# Patient Record
Sex: Female | Born: 2000 | Race: Black or African American | Hispanic: No | Marital: Single | Smoking: Former smoker
Health system: Southern US, Community
[De-identification: ages and names within clinical notes are randomized; demographics above are authoritative.]

## PROBLEM LIST (undated history)

## (undated) DIAGNOSIS — E559 Vitamin D deficiency, unspecified: Secondary | ICD-10-CM

## (undated) DIAGNOSIS — Z9109 Other allergy status, other than to drugs and biological substances: Secondary | ICD-10-CM

## (undated) DIAGNOSIS — J45909 Unspecified asthma, uncomplicated: Secondary | ICD-10-CM

---
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
## 2019-01-19 DIAGNOSIS — H40033 Anatomical narrow angle, bilateral: Secondary | ICD-10-CM | POA: Diagnosis not present

## 2019-01-19 DIAGNOSIS — H16223 Keratoconjunctivitis sicca, not specified as Sjogren's, bilateral: Secondary | ICD-10-CM | POA: Diagnosis not present

## 2019-03-05 DIAGNOSIS — H5213 Myopia, bilateral: Secondary | ICD-10-CM | POA: Diagnosis not present

## 2019-03-21 ENCOUNTER — Other Ambulatory Visit: Payer: Self-pay

## 2019-03-21 ENCOUNTER — Encounter (HOSPITAL_COMMUNITY): Payer: Self-pay

## 2019-03-21 ENCOUNTER — Emergency Department (HOSPITAL_COMMUNITY)
Admission: EM | Admit: 2019-03-21 | Discharge: 2019-03-21 | Disposition: A | Discharge status: Home / Self Care | Payer: Medicaid Other | Attending: Emergency Medicine | Admitting: Emergency Medicine

## 2019-03-21 ENCOUNTER — Emergency Department (HOSPITAL_COMMUNITY): Payer: Medicaid Other

## 2019-03-21 DIAGNOSIS — S0990XA Unspecified injury of head, initial encounter: Secondary | ICD-10-CM

## 2019-03-21 DIAGNOSIS — J45909 Unspecified asthma, uncomplicated: Secondary | ICD-10-CM | POA: Insufficient documentation

## 2019-03-21 DIAGNOSIS — Y9389 Activity, other specified: Secondary | ICD-10-CM | POA: Diagnosis not present

## 2019-03-21 DIAGNOSIS — Y999 Unspecified external cause status: Secondary | ICD-10-CM | POA: Diagnosis not present

## 2019-03-21 DIAGNOSIS — Y92810 Car as the place of occurrence of the external cause: Secondary | ICD-10-CM | POA: Diagnosis not present

## 2019-03-21 DIAGNOSIS — Y29XXXA Contact with blunt object, undetermined intent, initial encounter: Secondary | ICD-10-CM | POA: Diagnosis not present

## 2019-03-21 DIAGNOSIS — S060X0A Concussion without loss of consciousness, initial encounter: Secondary | ICD-10-CM | POA: Diagnosis not present

## 2019-03-21 DIAGNOSIS — F17210 Nicotine dependence, cigarettes, uncomplicated: Secondary | ICD-10-CM | POA: Diagnosis not present

## 2019-03-21 DIAGNOSIS — R5383 Other fatigue: Secondary | ICD-10-CM | POA: Diagnosis not present

## 2019-03-21 DIAGNOSIS — R51 Headache: Secondary | ICD-10-CM | POA: Diagnosis not present

## 2019-03-21 NOTE — ED Notes (Signed)
[place],MD made aware of patient needing CT.
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
## 2019-03-21 NOTE — ED Notes (Signed)
Pt ambulated in hall with steady gait.

## 2019-04-07 DIAGNOSIS — J302 Other seasonal allergic rhinitis: Secondary | ICD-10-CM | POA: Diagnosis not present

## 2019-04-07 DIAGNOSIS — L309 Dermatitis, unspecified: Secondary | ICD-10-CM | POA: Diagnosis not present

## 2019-04-07 DIAGNOSIS — E559 Vitamin D deficiency, unspecified: Secondary | ICD-10-CM | POA: Diagnosis not present

## 2019-04-07 DIAGNOSIS — J4520 Mild intermittent asthma, uncomplicated: Secondary | ICD-10-CM | POA: Diagnosis not present

## 2019-07-07 NOTE — L&D Delivery Note (Signed)
Delivery Note At 10:19 AM a viable female was delivered via Vaginal, Spontaneous (Presentation:   Occiput Anterior).  APGAR: 6 and 9, ; weight 2430g  Placenta status: Spontaneous, Intact.  Cord: 3 vessels with the following complications: None.  Cord pH: Unable to obtain  Anesthesia: Epidural Episiotomy: None Lacerations: Sulcus;Periurethral Suture Repair:  3-0 Vicryl and 2-0 Vicryl Est. Blood Loss (mL):  350cc  Mom to postpartum.  Baby to Couplet care / Skin to Skin.  Angie Mcdonald 04/10/2020, 10:56 AM

## 2019-08-08 DIAGNOSIS — H1013 Acute atopic conjunctivitis, bilateral: Secondary | ICD-10-CM | POA: Diagnosis not present

## 2019-08-28 DIAGNOSIS — J302 Other seasonal allergic rhinitis: Secondary | ICD-10-CM | POA: Diagnosis not present

## 2019-08-28 DIAGNOSIS — J4520 Mild intermittent asthma, uncomplicated: Secondary | ICD-10-CM | POA: Diagnosis not present

## 2019-08-28 DIAGNOSIS — N912 Amenorrhea, unspecified: Secondary | ICD-10-CM | POA: Diagnosis not present

## 2019-08-28 DIAGNOSIS — E559 Vitamin D deficiency, unspecified: Secondary | ICD-10-CM | POA: Diagnosis not present

## 2019-08-28 DIAGNOSIS — L309 Dermatitis, unspecified: Secondary | ICD-10-CM | POA: Diagnosis not present

## 2019-08-28 DIAGNOSIS — R110 Nausea: Secondary | ICD-10-CM | POA: Diagnosis not present

## 2019-08-28 DIAGNOSIS — Z3A01 Less than 8 weeks gestation of pregnancy: Secondary | ICD-10-CM | POA: Diagnosis not present

## 2019-09-07 DIAGNOSIS — H04123 Dry eye syndrome of bilateral lacrimal glands: Secondary | ICD-10-CM | POA: Diagnosis not present

## 2019-09-20 LAB — OB RESULTS CONSOLE RPR: RPR: NONREACTIVE

## 2019-09-20 LAB — OB RESULTS CONSOLE RUBELLA ANTIBODY, IGM: Rubella: IMMUNE

## 2019-09-20 LAB — OB RESULTS CONSOLE HIV ANTIBODY (ROUTINE TESTING): HIV: NONREACTIVE

## 2019-09-20 LAB — OB RESULTS CONSOLE HEPATITIS B SURFACE ANTIGEN: Hepatitis B Surface Ag: NEGATIVE

## 2019-09-20 LAB — OB RESULTS CONSOLE VARICELLA ZOSTER ANTIBODY, IGG: Varicella: IMMUNE

## 2019-09-22 DIAGNOSIS — Z3401 Encounter for supervision of normal first pregnancy, first trimester: Secondary | ICD-10-CM | POA: Diagnosis not present

## 2019-09-22 DIAGNOSIS — Z3201 Encounter for pregnancy test, result positive: Secondary | ICD-10-CM | POA: Diagnosis not present

## 2019-09-22 DIAGNOSIS — Z3400 Encounter for supervision of normal first pregnancy, unspecified trimester: Secondary | ICD-10-CM | POA: Diagnosis not present

## 2019-09-28 DIAGNOSIS — E669 Obesity, unspecified: Secondary | ICD-10-CM | POA: Diagnosis not present

## 2019-09-28 DIAGNOSIS — Z3401 Encounter for supervision of normal first pregnancy, first trimester: Secondary | ICD-10-CM | POA: Diagnosis not present

## 2019-09-28 DIAGNOSIS — Z3481 Encounter for supervision of other normal pregnancy, first trimester: Secondary | ICD-10-CM | POA: Diagnosis not present

## 2019-10-02 DIAGNOSIS — A64 Unspecified sexually transmitted disease: Secondary | ICD-10-CM | POA: Diagnosis not present

## 2019-10-02 DIAGNOSIS — H1013 Acute atopic conjunctivitis, bilateral: Secondary | ICD-10-CM | POA: Diagnosis not present

## 2019-11-03 DIAGNOSIS — O09292 Supervision of pregnancy with other poor reproductive or obstetric history, second trimester: Secondary | ICD-10-CM | POA: Diagnosis not present

## 2019-11-03 LAB — OB RESULTS CONSOLE GC/CHLAMYDIA
Chlamydia: NEGATIVE
Gonorrhea: NEGATIVE

## 2019-12-12 DIAGNOSIS — Z36 Encounter for antenatal screening for chromosomal anomalies: Secondary | ICD-10-CM | POA: Diagnosis not present

## 2019-12-13 DIAGNOSIS — Z3402 Encounter for supervision of normal first pregnancy, second trimester: Secondary | ICD-10-CM | POA: Diagnosis not present

## 2020-01-16 DIAGNOSIS — Z3402 Encounter for supervision of normal first pregnancy, second trimester: Secondary | ICD-10-CM | POA: Diagnosis not present

## 2020-02-13 DIAGNOSIS — Z23 Encounter for immunization: Secondary | ICD-10-CM | POA: Diagnosis not present

## 2020-02-13 DIAGNOSIS — Z3403 Encounter for supervision of normal first pregnancy, third trimester: Secondary | ICD-10-CM | POA: Diagnosis not present

## 2020-03-12 DIAGNOSIS — H5213 Myopia, bilateral: Secondary | ICD-10-CM | POA: Diagnosis not present

## 2020-03-25 IMAGING — CT CT HEAD W/O CM
3 series · 16 of 46 positions shown, 19 images · non-contrast
Comparison: None.

CLINICAL DATA: 18-year-old female with headache following head
injury yesterday. Initial encounter.

EXAM:
CT HEAD WITHOUT CONTRAST
TECHNIQUE: Contiguous axial images were obtained from the base of the skull
through the vertex without intravenous contrast.

[Series 2: head wo · axial · 0.47mm/px · z∈[-181,-61]mm · 10 of 29 slices shown, 13 images]
[im 3/29  brain]
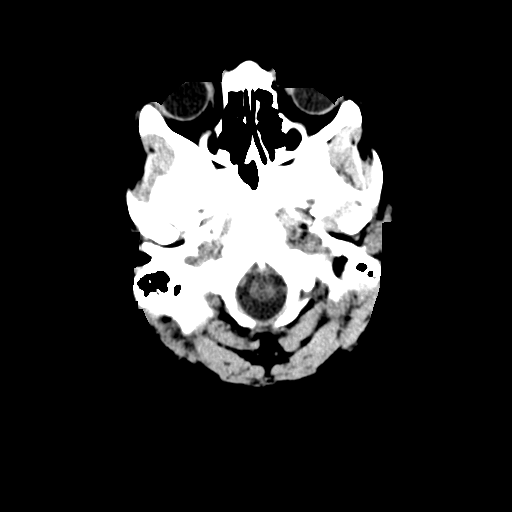
[im 3/29  bone]
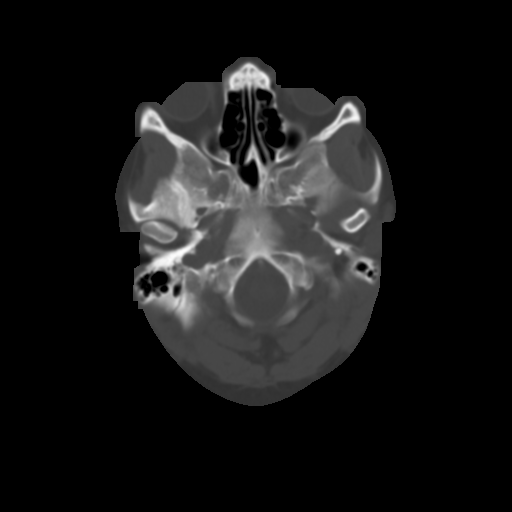
[im 6/29  brain]
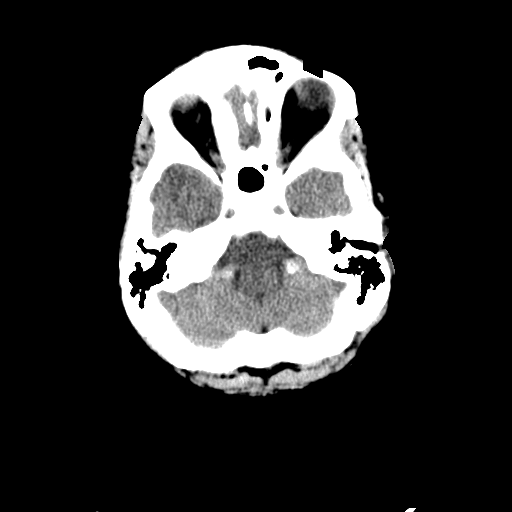
[im 8/29  brain]
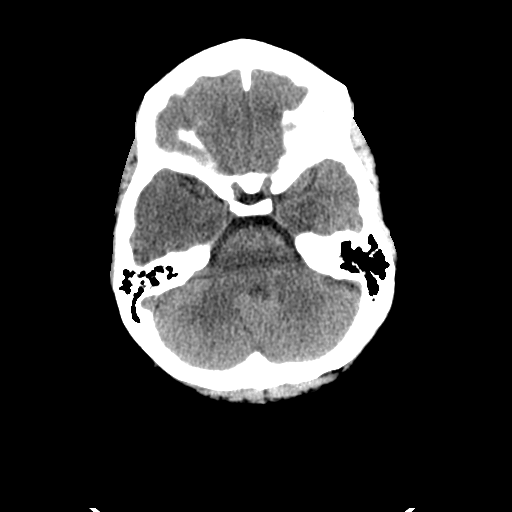
[im 11/29  brain]
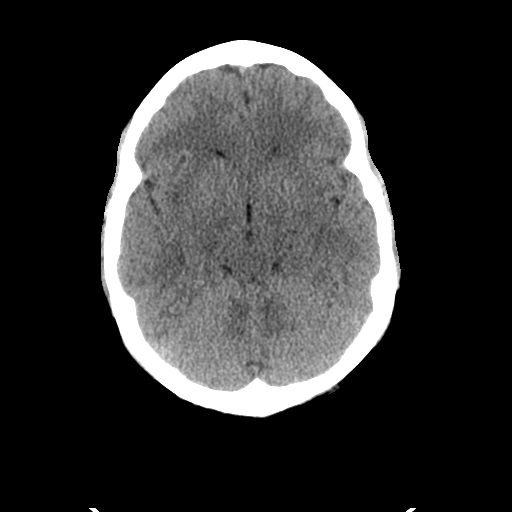
[im 14/29  brain]
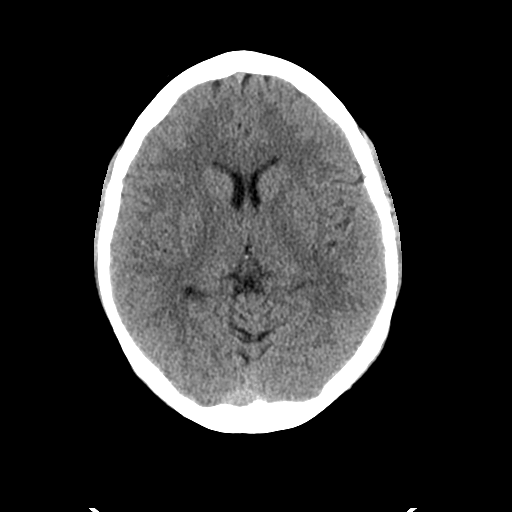
[im 14/29  bone]
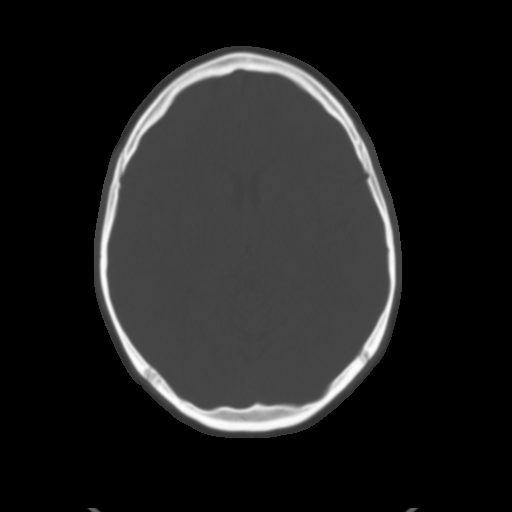
[im 16/29  brain]
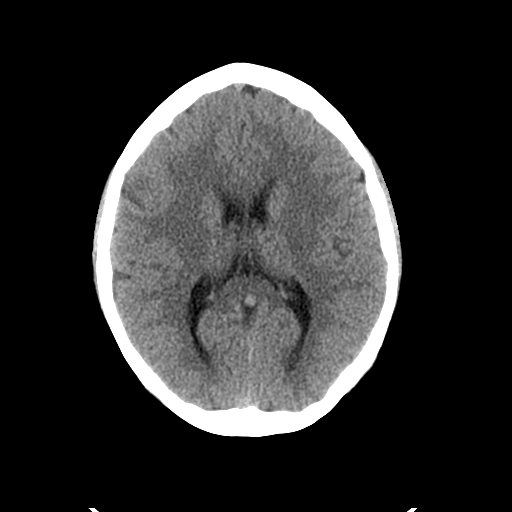
[im 19/29  brain]
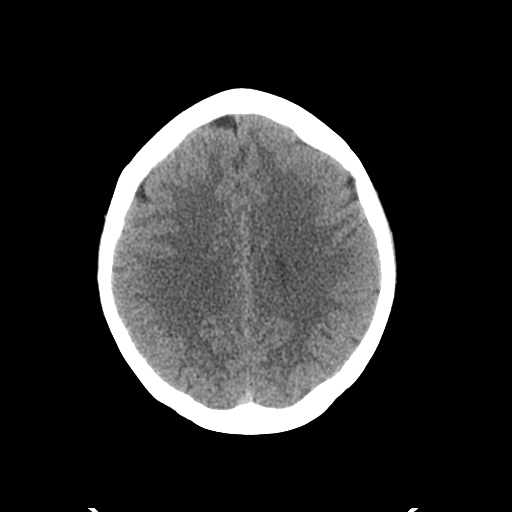
[im 22/29  brain]
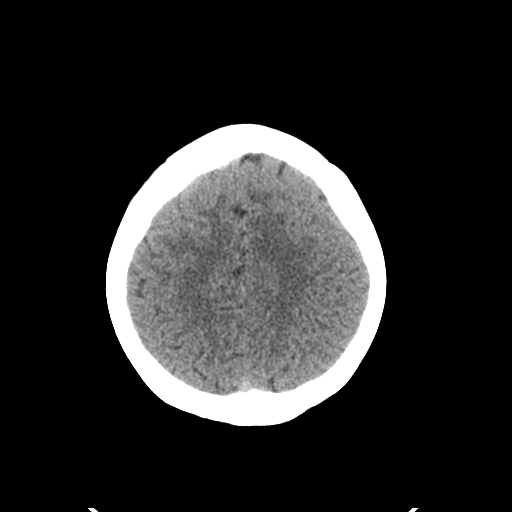
[im 24/29  brain]
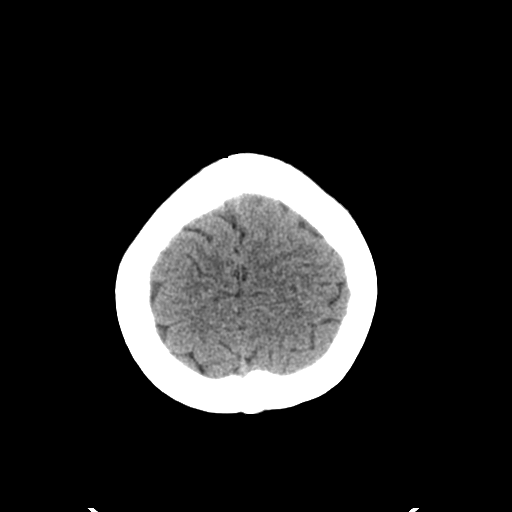
[im 24/29  bone]
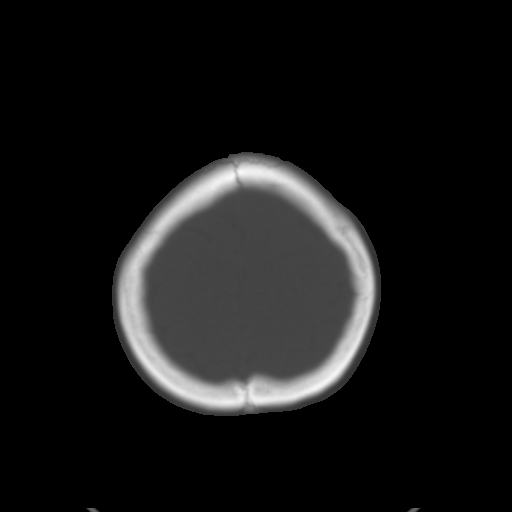
[im 27/29  brain]
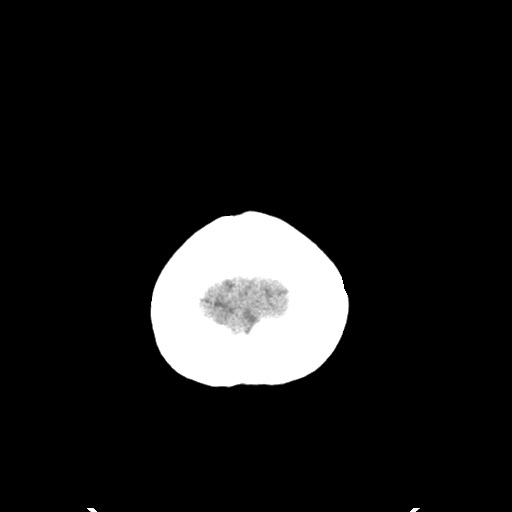

[Series 4: coronal soft tissue · coronal · 0.28mm/px · 3 of 63 slices shown]
[im 21/63  brain]
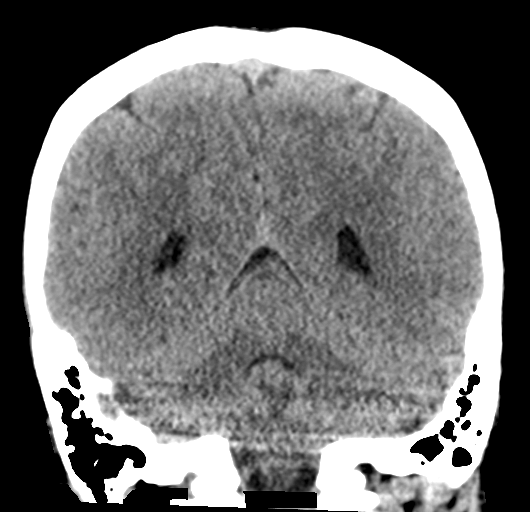
[im 28/63  brain]
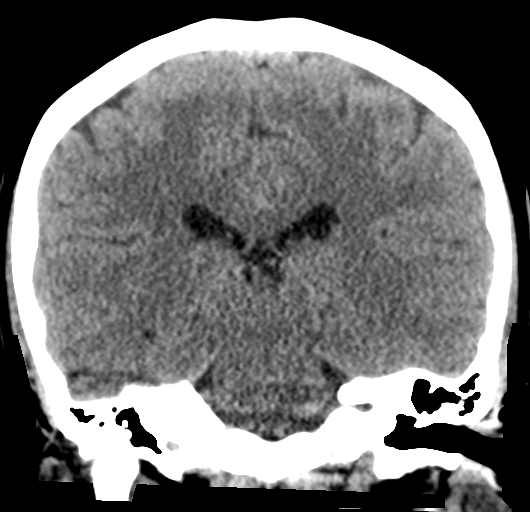
[im 35/63  brain]
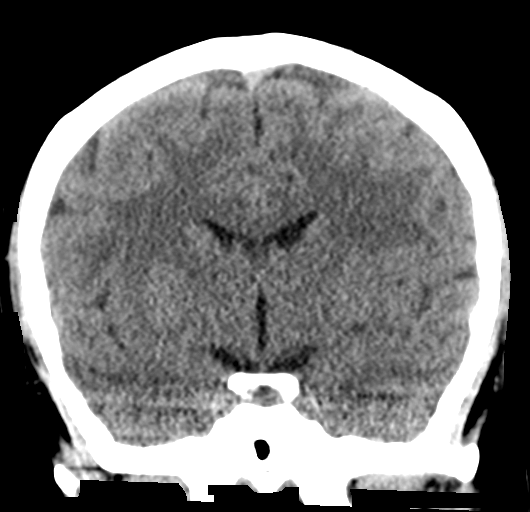

[Series 5: sagittal soft tissue · sagittal · 0.28mm/px · 3 of 51 slices shown]
[im 17/51  brain]
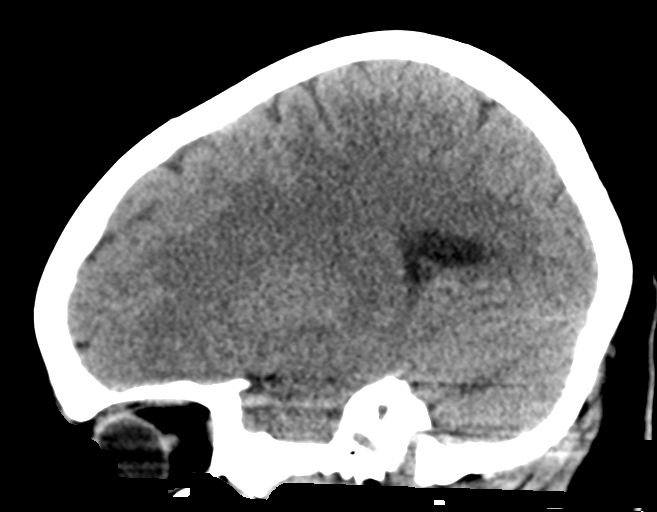
[im 26/51  brain]
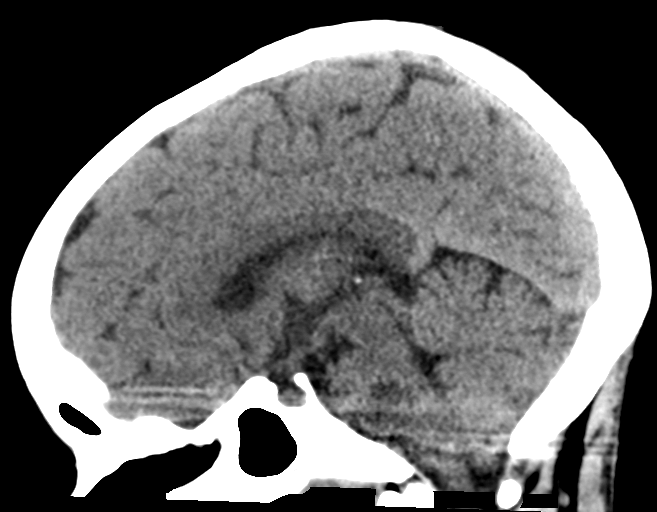
[im 34/51  brain]
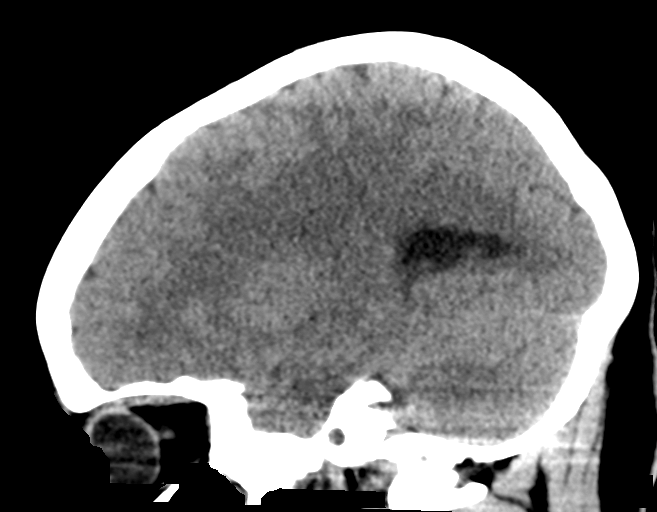

[16 of 46 positions shown; findings below may reference images not displayed]

FINDINGS: Brain: No evidence of acute infarction, hemorrhage, hydrocephalus,
extra-axial collection or mass lesion/mass effect.

Vascular: No hyperdense vessel or unexpected calcification.

Skull: Normal. Negative for fracture or focal lesion.

Sinuses/Orbits: No acute finding.

Other: None.
IMPRESSION: Unremarkable noncontrast head CT.

## 2020-03-27 DIAGNOSIS — O26843 Uterine size-date discrepancy, third trimester: Secondary | ICD-10-CM | POA: Diagnosis not present

## 2020-03-27 DIAGNOSIS — Z3403 Encounter for supervision of normal first pregnancy, third trimester: Secondary | ICD-10-CM | POA: Diagnosis not present

## 2020-03-27 LAB — OB RESULTS CONSOLE GBS: GBS: NEGATIVE

## 2020-04-03 DIAGNOSIS — O365931 Maternal care for other known or suspected poor fetal growth, third trimester, fetus 1: Secondary | ICD-10-CM | POA: Diagnosis not present

## 2020-04-05 ENCOUNTER — Encounter (HOSPITAL_COMMUNITY): Payer: Self-pay | Admitting: *Deleted

## 2020-04-05 ENCOUNTER — Telehealth (HOSPITAL_COMMUNITY): Payer: Self-pay | Admitting: *Deleted

## 2020-04-05 NOTE — Telephone Encounter (Signed)
Preadmission screen  

## 2020-04-05 NOTE — Telephone Encounter (Signed)
Preadmission screen Prenatal requested from office.  Pt states she thinks she is scheduled for induction on Sunday.  Instructed pt to call MD office and verify plan of induction.

## 2020-04-06 ENCOUNTER — Other Ambulatory Visit (HOSPITAL_COMMUNITY)
Admission: RE | Admit: 2020-04-06 | Discharge: 2020-04-06 | Disposition: A | Discharge status: Home / Self Care | Payer: Medicaid Other | Source: Ambulatory Visit | Attending: Obstetrics & Gynecology | Admitting: Obstetrics & Gynecology

## 2020-04-06 DIAGNOSIS — Z20822 Contact with and (suspected) exposure to covid-19: Secondary | ICD-10-CM | POA: Diagnosis not present

## 2020-04-06 DIAGNOSIS — Z01812 Encounter for preprocedural laboratory examination: Secondary | ICD-10-CM | POA: Diagnosis present

## 2020-04-06 LAB — SARS CORONAVIRUS 2 (TAT 6-24 HRS): SARS Coronavirus 2: NEGATIVE

## 2020-04-08 ENCOUNTER — Other Ambulatory Visit (HOSPITAL_COMMUNITY): Payer: No Typology Code available for payment source

## 2020-04-08 NOTE — H&P (Signed)
HPI: 19 y/o G1P0 @ [redacted]w[redacted]d estimated gestational age (as dated by LMP c/w 20 week ultrasound) presents for IOL due to IUGR.   no Leaking of Fluid,   no Vaginal Bleeding,   no Uterine Contractions,  + Fetal Movement.  Prenatal care has been provided by Dr. Charlotta Newton  ROS: no HA, no epigastric pain, no visual changes.    Pregnancy complicated by: 1) IUGR- [place] @ [redacted]w[redacted]d-vertex/post/EFW: 5#5oz (10%) 2) Obesity: starting BMI 45 3) h/o asthma- proair HFA when needed   Prenatal Transfer Tool  Maternal Diabetes: No Genetic Screening: Normal Maternal Ultrasounds/Referrals: IUGR Fetal Ultrasounds or other Referrals:  None Maternal Substance Abuse:  No Significant Maternal Medications:  Meds include: Other: ProAir Significant Maternal Lab Results: Group B Strep negative   PNL:  GBS neg, Rub Immune, Hep B neg, RPR NR, HIV neg, GC/C neg, glucola:105 Hgb: 11.4 Blood type: O pos, antibody neg  Immunizations: Tdap: 8/10  OBHx: primip PMHx:  Asthma Meds:  PNV, ProAir Allergy:  No Known Allergies SurgHx: none SocHx:   no Tobacco, no  EtOH, no Illicit Drugs  O: to be obtained Gen. AAOx3, NAD CV.  RRR  No murmur.  Resp. CTAB, no wheeze or crackles. Abd. Gravid,  no tenderness,  no rigidity,  no guarding Extr.  no edema B/L , no calf tenderness, neg Homan's B/L FHT: 140 by doppler  Labs: see orders  A/P:  19 y.o. G1P0 @ [redacted]w[redacted]d EGA who presents for IOL due to IUGR -FWB:  Reassuring by doppler -Labor: plan for IOL with cytotec -GBS: negative -Pain management: IV or epidural upon request  Myna Hidalgo, DO [phone] (cell) [phone] (office)

## 2020-04-09 ENCOUNTER — Inpatient Hospital Stay (HOSPITAL_COMMUNITY): Payer: Medicaid Other | Admitting: Anesthesiology

## 2020-04-09 ENCOUNTER — Inpatient Hospital Stay (HOSPITAL_COMMUNITY)
Admission: AD | Admit: 2020-04-09 | Discharge: 2020-04-12 | DRG: 807 | Disposition: A | Discharge status: Home / Self Care | Payer: Medicaid Other | Attending: Obstetrics and Gynecology | Admitting: Obstetrics and Gynecology

## 2020-04-09 ENCOUNTER — Encounter (HOSPITAL_COMMUNITY): Payer: Self-pay | Admitting: Obstetrics & Gynecology

## 2020-04-09 ENCOUNTER — Inpatient Hospital Stay (HOSPITAL_COMMUNITY): Payer: Medicaid Other

## 2020-04-09 ENCOUNTER — Other Ambulatory Visit: Payer: Self-pay

## 2020-04-09 DIAGNOSIS — O365930 Maternal care for other known or suspected poor fetal growth, third trimester, not applicable or unspecified: Secondary | ICD-10-CM | POA: Diagnosis not present

## 2020-04-09 DIAGNOSIS — J45909 Unspecified asthma, uncomplicated: Secondary | ICD-10-CM | POA: Diagnosis not present

## 2020-04-09 DIAGNOSIS — Z3A38 38 weeks gestation of pregnancy: Secondary | ICD-10-CM | POA: Diagnosis not present

## 2020-04-09 DIAGNOSIS — O9952 Diseases of the respiratory system complicating childbirth: Secondary | ICD-10-CM | POA: Diagnosis not present

## 2020-04-09 DIAGNOSIS — O99214 Obesity complicating childbirth: Secondary | ICD-10-CM | POA: Diagnosis not present

## 2020-04-09 DIAGNOSIS — O365990 Maternal care for other known or suspected poor fetal growth, unspecified trimester, not applicable or unspecified: Secondary | ICD-10-CM | POA: Diagnosis present

## 2020-04-09 LAB — CBC
HCT: 32.4 % — ABNORMAL LOW (ref 36.0–46.0)
Hemoglobin: 10.6 g/dL — ABNORMAL LOW (ref 12.0–15.0)
MCH: 25.4 pg — ABNORMAL LOW (ref 26.0–34.0)
MCHC: 32.7 g/dL (ref 30.0–36.0)
MCV: 77.7 fL — ABNORMAL LOW (ref 80.0–100.0)
Platelets: 274 10*3/uL (ref 150–400)
RBC: 4.17 MIL/uL (ref 3.87–5.11)
RDW: 12.9 % (ref 11.5–15.5)
WBC: 11.4 10*3/uL — ABNORMAL HIGH (ref 4.0–10.5)
nRBC: 0 % (ref 0.0–0.2)

## 2020-04-09 LAB — RPR: RPR Ser Ql: NONREACTIVE

## 2020-04-09 LAB — TYPE AND SCREEN
ABO/RH(D): O POS
Antibody Screen: NEGATIVE

## 2020-04-09 MED ORDER — OXYTOCIN BOLUS FROM INFUSION
333.0000 mL | Freq: Once | INTRAVENOUS | Status: AC
  Administered 2020-04-10: 333 mL via INTRAVENOUS

## 2020-04-09 MED ORDER — OXYTOCIN-SODIUM CHLORIDE 30-0.9 UT/500ML-% IV SOLN
1.0000 m[IU]/min | INTRAVENOUS | Status: DC
  Administered 2020-04-10: 4 m[IU]/min via INTRAVENOUS

## 2020-04-09 MED ORDER — LIDOCAINE-EPINEPHRINE (PF) 2 %-1:200000 IJ SOLN
INTRAMUSCULAR | Status: DC | PRN
  Administered 2020-04-09 (×2): 3 mL via EPIDURAL

## 2020-04-09 MED ORDER — SOD CITRATE-CITRIC ACID 500-334 MG/5ML PO SOLN
30.0000 mL | ORAL | Status: DC | PRN
  Administered 2020-04-09: 30 mL via ORAL
  Filled 2020-04-09: qty 15

## 2020-04-09 MED ORDER — FENTANYL CITRATE (PF) 2500 MCG/50ML IJ SOLN
INTRAMUSCULAR | Status: DC | PRN
Start: 2020-04-09 — End: 2020-04-10
  Administered 2020-04-09: 12 mL/h via EPIDURAL

## 2020-04-09 MED ORDER — FENTANYL-BUPIVACAINE-NACL 0.5-0.125-0.9 MG/250ML-% EP SOLN
12.0000 mL/h | EPIDURAL | Status: DC | PRN
  Filled 2020-04-09: qty 250

## 2020-04-09 MED ORDER — EPHEDRINE 5 MG/ML INJ: 10.0000 mg | INTRAVENOUS | Status: DC | PRN

## 2020-04-09 MED ORDER — LACTATED RINGERS IV SOLN: 500.0000 mL | INTRAVENOUS | Status: DC | PRN

## 2020-04-09 MED ORDER — ONDANSETRON HCL 4 MG/2ML IJ SOLN
4.0000 mg | Freq: Four times a day (QID) | INTRAMUSCULAR | Status: DC | PRN
  Administered 2020-04-10: 4 mg via INTRAVENOUS
  Filled 2020-04-09: qty 2

## 2020-04-09 MED ORDER — PHENYLEPHRINE 40 MCG/ML (10ML) SYRINGE FOR IV PUSH (FOR BLOOD PRESSURE SUPPORT): 80.0000 ug | PREFILLED_SYRINGE | INTRAVENOUS | Status: DC | PRN

## 2020-04-09 MED ORDER — LACTATED RINGERS IV SOLN: INTRAVENOUS | Status: DC

## 2020-04-09 MED ORDER — OXYCODONE-ACETAMINOPHEN 5-325 MG PO TABS: 1.0000 | ORAL_TABLET | ORAL | Status: DC | PRN

## 2020-04-09 MED ORDER — DIPHENHYDRAMINE HCL 50 MG/ML IJ SOLN: 12.5000 mg | INTRAMUSCULAR | Status: DC | PRN

## 2020-04-09 MED ORDER — OXYTOCIN-SODIUM CHLORIDE 30-0.9 UT/500ML-% IV SOLN
1.0000 m[IU]/min | INTRAVENOUS | Status: DC
  Administered 2020-04-09: 1 m[IU]/min via INTRAVENOUS
  Filled 2020-04-09: qty 500

## 2020-04-09 MED ORDER — ACETAMINOPHEN 325 MG PO TABS: 650.0000 mg | ORAL_TABLET | ORAL | Status: DC | PRN

## 2020-04-09 MED ORDER — LIDOCAINE HCL (PF) 1 % IJ SOLN: 30.0000 mL | INTRAMUSCULAR | Status: DC | PRN

## 2020-04-09 MED ORDER — TERBUTALINE SULFATE 1 MG/ML IJ SOLN: 0.2500 mg | Freq: Once | INTRAMUSCULAR | Status: DC | PRN

## 2020-04-09 MED ORDER — OXYCODONE-ACETAMINOPHEN 5-325 MG PO TABS: 2.0000 | ORAL_TABLET | ORAL | Status: DC | PRN

## 2020-04-09 MED ORDER — MISOPROSTOL 25 MCG QUARTER TABLET
25.0000 ug | ORAL_TABLET | ORAL | Status: DC | PRN
  Administered 2020-04-09 (×3): 25 ug via VAGINAL
  Filled 2020-04-09 (×3): qty 1

## 2020-04-09 MED ORDER — FENTANYL CITRATE (PF) 100 MCG/2ML IJ SOLN
50.0000 ug | INTRAMUSCULAR | Status: DC | PRN
  Administered 2020-04-09 (×3): 100 ug via INTRAVENOUS
  Filled 2020-04-09 (×3): qty 2

## 2020-04-09 MED ORDER — LACTATED RINGERS IV SOLN
500.0000 mL | Freq: Once | INTRAVENOUS | Status: AC
  Administered 2020-04-09: 500 mL via INTRAVENOUS

## 2020-04-09 MED ORDER — OXYTOCIN-SODIUM CHLORIDE 30-0.9 UT/500ML-% IV SOLN: 2.5000 [IU]/h | INTRAVENOUS | Status: DC

## 2020-04-09 NOTE — Progress Notes (Addendum)
OB Progress Note  S: Reports some mild cramping.  Has had 2 cytotecs so far.  O: BP (!) 89/54   Pulse 78   Temp 98.7 F (37.1 C) (Oral)   Resp 18   Ht 4\' 9"  (1.448 m)   Wt 99.8 kg   LMP 07/17/2019   BMI 47.63 kg/m   FHT: 140s bpm, moderate variablity, + accels, - decels Toco: Irritability SVE: 1/thick/high  A/P: 19 y.o. G1P0 @ [redacted]w[redacted]d admitted for IOL for IUGR 10%. 1. FWB: Cat. I 2. Labor: Continue cervical ripening with Cytotec, unsuccessful foley bulb placement due to intolerance of exam. Pain: Per patient request GBS: Negative  [redacted]w[redacted]d, DO [phone] (office)

## 2020-04-09 NOTE — Anesthesia Procedure Notes (Signed)

## 2020-04-09 NOTE — Progress Notes (Signed)
OB Progress Note  S: Reports some mild cramping.  Status post cytotec x 3.    O: BP 109/65   Pulse 74   Temp 97.9 F (36.6 C) (Oral)   Resp 18   Ht 4\' 9"  (1.448 m)   Wt 99.8 kg   LMP 07/17/2019   BMI 47.63 kg/m   FHT: 140s bpm, moderate variablity, + accels, - decels Toco: every 1-1.5 minutes SVE: 1-2/40/high, cervix posterior, firm but softer than previous exam  A/P: 19 y.o. G1P0 @ [redacted]w[redacted]d admitted for IOL for IUGR 10%. 1. FWB: Cat. I 2. Labor: Status post cytotec x 3.  FB placed without difficulty and placed to traction. Pain: Fentanyl [redacted]w[redacted]d x 1 prior to FB insertion GBS: Negative  , DO [phone] (office)

## 2020-04-09 NOTE — Progress Notes (Signed)
   Subjective: Patient is comfortable with her epidural   Objective: BP (!) 95/58   Pulse 75   Temp 99.8 F (37.7 C) (Oral)   Resp 17   Ht 4\' 9"  (1.448 m)   Wt 99.8 kg   LMP 07/17/2019   SpO2 100%   BMI 47.63 kg/m  No intake/output data recorded. No intake/output data recorded.  FHT:  FHR: 140 bpm, variability: moderate,  accelerations:  Present,  decelerations:  Absent UC:   regular, every 1 minutes SVE:   Dilation: 6 Effacement (%): 80 Station: -1 (molding) Exam by:: Dr. [email or web address]  Labs: Lab Results  Component Value Date   WBC 11.4 (H) 04/09/2020   HGB 10.6 (L) 04/09/2020   HCT 32.4 (L) 04/09/2020   MCV 77.7 (L) 04/09/2020   PLT 274 04/09/2020    Assessment / Plan: Induction of labor due to IUGR,  progressing well on pitocin  Labor: Progressing normally Preeclampsia:  NA Fetal Wellbeing:  Category I Pain Control:  Epidural I/D:  n/a Anticipated MOD:  NSVD  06/09/2020 04/09/2020, 10:57 PM

## 2020-04-09 NOTE — Progress Notes (Signed)
   Subjective: Pt reports contractions every 2 minutes   Objective: BP (!) 97/54   Pulse 65   Temp 99.8 F (37.7 C) (Oral)   Resp 17   Ht 4\' 9"  (1.448 m)   Wt 99.8 kg   LMP 07/17/2019   BMI 47.63 kg/m  No intake/output data recorded. No intake/output data recorded.  FHT:  FHR: 140 bpm, variability: moderate,  accelerations:  Present,  decelerations:  Absent UC:   regular, every 2 minutes SVE:   Dilation: 4 Effacement (%): 50 Station: -3 Exam by:: Dr. [email or web address]  Labs: Lab Results  Component Value Date   WBC 11.4 (H) 04/09/2020   HGB 10.6 (L) 04/09/2020   HCT 32.4 (L) 04/09/2020   MCV 77.7 (L) 04/09/2020   PLT 274 04/09/2020    Assessment / Plan: Induction of labor due to IUGR,  progressing well on pitocin  Labor: Progressing on Pitocin, will continue to increase then AROM Preeclampsia:  NA Fetal Wellbeing:  Category I Pain Control:  IV pain meds I/D:  n/a Anticipated MOD:  NSVD  06/09/2020 04/09/2020, 6:11 PM

## 2020-04-09 NOTE — Anesthesia Preprocedure Evaluation (Signed)
Anesthesia Evaluation  Patient identified by MRN, date of birth, ID band Patient awake    Reviewed: Allergy & Precautions, NPO status , Patient's Chart, lab work & pertinent test results  Airway Mallampati: II  TM Distance: >3 FB Neck ROM: Full    Dental no notable dental hx.    Pulmonary asthma , former smoker,    Pulmonary exam normal breath sounds clear to auscultation       Cardiovascular negative cardio ROS Normal cardiovascular exam Rhythm:Regular Rate:Normal     Neuro/Psych negative neurological ROS  negative psych ROS   GI/Hepatic negative GI ROS, Neg liver ROS,   Endo/Other  Morbid obesity (BMI 48)  Renal/GU negative Renal ROS  negative genitourinary   Musculoskeletal negative musculoskeletal ROS (+)   Abdominal   Peds  Hematology negative hematology ROS (+)   Anesthesia Other Findings IOL for IUGR  Reproductive/Obstetrics (+) Pregnancy                             Anesthesia Physical Anesthesia Plan  ASA: III  Anesthesia Plan: Epidural   Post-op Pain Management:    Induction:   PONV Risk Score and Plan: Treatment may vary due to age or medical condition  Airway Management Planned: Natural Airway  Additional Equipment:   Intra-op Plan:   Post-operative Plan:   Informed Consent: I have reviewed the patients History and Physical, chart, labs and discussed the procedure including the risks, benefits and alternatives for the proposed anesthesia with the patient or authorized representative who has indicated his/her understanding and acceptance.       Plan Discussed with: Anesthesiologist  Anesthesia Plan Comments: (Patient identified. Risks, benefits, options discussed with patient including but not limited to bleeding, infection, nerve damage, paralysis, failed block, incomplete pain control, headache, blood pressure changes, nausea, vomiting, reactions to  medication, itching, and post partum back pain. Confirmed with bedside nurse the patient's most recent platelet count. Confirmed with the patient that they are not taking any anticoagulation, have any bleeding history or any family history of bleeding disorders. Patient expressed understanding and wishes to proceed. All questions were answered. )        Anesthesia Quick Evaluation

## 2020-04-09 NOTE — Progress Notes (Signed)
  Subjective: Patient is comfortable with the epidural.   Objective: BP 112/70   Pulse 74   Temp 99.8 F (37.7 C) (Oral)   Resp 17   Ht 4\' 9"  (1.448 m)   Wt 99.8 kg   LMP 07/17/2019   SpO2 100%   BMI 47.63 kg/m  No intake/output data recorded. No intake/output data recorded.  FHT:  FHR: 145 bpm, variability: moderate,  accelerations:  Present,  decelerations:  Absent UC:   regular, every 2-5 minutes on 8 mu of pitocin.03/11/2021Marland KitchenIUPC placed  SVE:   Dilation: 5.5 Effacement (%): 60, 70 Station: -1 Exam by:: MScott, RN  Labs: Lab Results  Component Value Date   WBC 11.4 (H) 04/09/2020   HGB 10.6 (L) 04/09/2020   HCT 32.4 (L) 04/09/2020   MCV 77.7 (L) 04/09/2020   PLT 274 04/09/2020    Assessment / Plan: induction of labor due to IUGR... IUPC placed.. will continue pitocin to reach mvU's of 200  Labor: continue pitocin Preeclampsia:  NA Fetal Wellbeing:  Category I Pain Control:  Epidural I/D:  n/a Anticipated MOD:  NSVD  06/09/2020 04/09/2020, 8:45 PM

## 2020-04-10 ENCOUNTER — Encounter (HOSPITAL_COMMUNITY): Payer: Self-pay | Admitting: Obstetrics & Gynecology

## 2020-04-10 DIAGNOSIS — Z3A38 38 weeks gestation of pregnancy: Secondary | ICD-10-CM | POA: Diagnosis not present

## 2020-04-10 MED ORDER — MISOPROSTOL 200 MCG PO TABS
ORAL_TABLET | ORAL | Status: AC
  Administered 2020-04-10: 1000 ug
  Filled 2020-04-10: qty 5

## 2020-04-10 MED ORDER — SIMETHICONE 80 MG PO CHEW: 80.0000 mg | CHEWABLE_TABLET | ORAL | Status: DC | PRN

## 2020-04-10 MED ORDER — COCONUT OIL OIL: 1.0000 "application " | TOPICAL_OIL | Status: DC | PRN

## 2020-04-10 MED ORDER — BENZOCAINE-MENTHOL 20-0.5 % EX AERO
1.0000 "application " | INHALATION_SPRAY | CUTANEOUS | Status: DC | PRN
  Administered 2020-04-10: 1 via TOPICAL
  Filled 2020-04-10: qty 56

## 2020-04-10 MED ORDER — ZOLPIDEM TARTRATE 5 MG PO TABS: 5.0000 mg | ORAL_TABLET | Freq: Every evening | ORAL | Status: DC | PRN

## 2020-04-10 MED ORDER — WITCH HAZEL-GLYCERIN EX PADS: 1.0000 "application " | MEDICATED_PAD | CUTANEOUS | Status: DC | PRN

## 2020-04-10 MED ORDER — DIPHENHYDRAMINE HCL 25 MG PO CAPS: 25.0000 mg | ORAL_CAPSULE | Freq: Four times a day (QID) | ORAL | Status: DC | PRN

## 2020-04-10 MED ORDER — IBUPROFEN 600 MG PO TABS
600.0000 mg | ORAL_TABLET | Freq: Four times a day (QID) | ORAL | Status: DC
  Administered 2020-04-10 – 2020-04-12 (×9): 600 mg via ORAL
  Filled 2020-04-10 (×9): qty 1

## 2020-04-10 MED ORDER — SENNOSIDES-DOCUSATE SODIUM 8.6-50 MG PO TABS
2.0000 | ORAL_TABLET | ORAL | Status: DC
  Administered 2020-04-10: 2 via ORAL
  Filled 2020-04-10 (×2): qty 2

## 2020-04-10 MED ORDER — ACETAMINOPHEN 325 MG PO TABS
650.0000 mg | ORAL_TABLET | ORAL | Status: DC | PRN
  Administered 2020-04-10: 650 mg via ORAL
  Filled 2020-04-10: qty 2

## 2020-04-10 MED ORDER — ONDANSETRON HCL 4 MG PO TABS: 4.0000 mg | ORAL_TABLET | ORAL | Status: DC | PRN

## 2020-04-10 MED ORDER — DIBUCAINE (PERIANAL) 1 % EX OINT: 1.0000 "application " | TOPICAL_OINTMENT | CUTANEOUS | Status: DC | PRN

## 2020-04-10 MED ORDER — ALBUTEROL SULFATE HFA 108 (90 BASE) MCG/ACT IN AERS
2.0000 | INHALATION_SPRAY | RESPIRATORY_TRACT | Status: DC | PRN
  Filled 2020-04-10: qty 6.7

## 2020-04-10 MED ORDER — PRENATAL MULTIVITAMIN CH
1.0000 | ORAL_TABLET | Freq: Every day | ORAL | Status: DC
  Administered 2020-04-11 – 2020-04-12 (×2): 1 via ORAL
  Filled 2020-04-10 (×2): qty 1

## 2020-04-10 MED ORDER — ONDANSETRON HCL 4 MG/2ML IJ SOLN: 4.0000 mg | INTRAMUSCULAR | Status: DC | PRN

## 2020-04-10 NOTE — Progress Notes (Signed)
   Subjective: Patient is comfortable with her epidural   Objective: BP 118/76   Pulse 87   Temp 98.2 F (36.8 C) (Oral)   Resp 17   Ht 4\' 9"  (1.448 m)   Wt 99.8 kg   LMP 07/17/2019   SpO2 100%   BMI 47.63 kg/m  No intake/output data recorded. Total I/O In: -  Out: 575 [Urine:575]  FHT:  FHR: 140 bpm, variability: moderate,  accelerations:  Present,  decelerations:  Absent UC:   irregular, every 2-5 minutes SVE:   Dilation: 9 Effacement (%): 90 Station: Plus 1, Plus 2 Exam by:: Dr. [email or web address]  Labs: Lab Results  Component Value Date   WBC 11.4 (H) 04/09/2020   HGB 10.6 (L) 04/09/2020   HCT 32.4 (L) 04/09/2020   MCV 77.7 (L) 04/09/2020   PLT 274 04/09/2020    Assessment / Plan: induction of labor due to IUGR.. pitocin discontinued due to tachysystole... this resolve will now restart pitocin 2x2  Labor: protracted active phase  Preeclampsia:  NA Fetal Wellbeing:  Category I Pain Control:  Epidural I/D:  n/a Anticipated MOD:  NSVD  06/09/2020 04/10/2020, 6:39 AM

## 2020-04-10 NOTE — Progress Notes (Signed)
Transported to mother baby.

## 2020-04-10 NOTE — Progress Notes (Signed)
OB Progress Note  S: Comfortable with epidural.  Agreeable to practice pushing.  O: BP 128/83 (BP Location: Left Arm)    Pulse 88    Temp 98.9 F (37.2 C) (Oral)    Resp 18    Ht 4\' 9"  (1.448 m)    Wt 99.8 kg    LMP 07/17/2019    SpO2 100%    BMI 47.63 kg/m   FHT: 120 bpm, moderate variablity, + accels, - decels Toco: 1-3 minutes SVE: reducible anterior lip, 0 station  A/P: 19 y.o. G1P0 @ [redacted]w[redacted]d admitted for IOL for IUGR 10%. 1. FWB: Cat. I 2. Labor: Status post cytotec x 3 and FB.  Attempted practice pushing with good maternal effort but will titrate pitocin upward to allow for more frequent and stronger contractions before pushing again. Pain: Epidural in place GBS: Negative  [redacted]w[redacted]d, DO [phone] (office)

## 2020-04-10 NOTE — Anesthesia Postprocedure Evaluation (Signed)
Anesthesia Post Note  Patient: Angie Mcdonald  Procedure(s) Performed: AN AD HOC LABOR EPIDURAL     Patient location during evaluation: Mother Baby Anesthesia Type: Epidural Level of consciousness: awake and alert and oriented Pain management: satisfactory to patient Vital Signs Assessment: post-procedure vital signs reviewed and stable Respiratory status: spontaneous breathing and nonlabored ventilation Cardiovascular status: stable Postop Assessment: no headache, no backache, no signs of nausea or vomiting, adequate PO intake, patient able to bend at knees and able to ambulate (patient up walking) Anesthetic complications: no   No complications documented.  Last Vitals:  Vitals:   04/10/20 1220 04/10/20 1322  BP: 107/80 112/80  Pulse: 97 93  Resp: 18 18  Temp: 36.5 C 37.2 C  SpO2: 100% 99%    Last Pain:  Vitals:   04/10/20 1322  TempSrc:   PainSc: 0-No pain   Pain Goal: Patients Stated Pain Goal: 3 (04/10/20 1220)                 Madison Hickman

## 2020-04-11 LAB — CBC
HCT: 27.1 % — ABNORMAL LOW (ref 36.0–46.0)
Hemoglobin: 8.9 g/dL — ABNORMAL LOW (ref 12.0–15.0)
MCH: 25.7 pg — ABNORMAL LOW (ref 26.0–34.0)
MCHC: 32.8 g/dL (ref 30.0–36.0)
MCV: 78.3 fL — ABNORMAL LOW (ref 80.0–100.0)
Platelets: 224 10*3/uL (ref 150–400)
RBC: 3.46 MIL/uL — ABNORMAL LOW (ref 3.87–5.11)
RDW: 13.3 % (ref 11.5–15.5)
WBC: 19.9 10*3/uL — ABNORMAL HIGH (ref 4.0–10.5)
nRBC: 0 % (ref 0.0–0.2)

## 2020-04-11 NOTE — Progress Notes (Signed)
Pt out walking in hallway and c/o of a cramp in the back of her right leg just above the knee. Physical observation unremarkable, no heat to touch, no calf pain. Small heat pad applied and pain relieved.  Pt asked to call me if she has further pain.

## 2020-04-12 MED ORDER — IBUPROFEN 800 MG PO TABS: 800.0000 mg | ORAL_TABLET | Freq: Three times a day (TID) | ORAL | 0 refills | Status: DC | PRN

## 2020-04-12 NOTE — Discharge Summary (Signed)
Postpartum Discharge Summary  04/12/20    Patient Name: Angie Mcdonald DOB: 07/10/2000 MRN: [id number]  Date of admission: 04/09/2020 Delivery date:04/10/2020  Delivering provider: DAVIES, MELISSA  Date of discharge: 04/12/2020  Admitting diagnosis: IUGR (intrauterine growth restriction) affecting care of mother [O36.5990] Intrauterine pregnancy: [redacted]w[redacted]d     Secondary diagnosis:  Active Problems:   IUGR (intrauterine growth restriction) affecting care of mother  Additional problems: Maternal obesity (BMI 45), hx asthma   Discharge diagnosis: Term Pregnancy Delivered                                              Post partum procedures:None Augmentation: AROM, Pitocin, Cytotec and IP Foley Complications: None  Hospital course: Induction of Labor With Vaginal Delivery   19 y.o. yo G1P1001 at [redacted]w[redacted]d was admitted to the hospital 04/09/2020 for induction of labor.  Indication for induction: IUGR 10%.  Patient had an uncomplicated labor course as follows: Membrane Rupture Time/Date: 6:14 PM ,04/09/2020   Delivery Method:Vaginal, Spontaneous  Episiotomy: None  Lacerations:  Sulcus;Periurethral  Details of delivery can be found in separate delivery note.  Patient had a routine postpartum course. Patient is discharged home 04/12/20.  Newborn Data: Birth date:04/10/2020  Birth time:10:19 AM  Gender:Female  Living status:Living  Apgars:6 ,9  Weight:2430 g   Magnesium Sulfate received: No BMZ received: No Rhophylac:N/A MMR:N/A T-DaP:Given prenatally Flu: No Transfusion:No  Physical exam  Vitals:   04/11/20 0522 04/11/20 1400 04/11/20 2338 04/12/20 0525  BP: (!) 88/51 99/78 (!) 98/58 103/68  Pulse: 82 73 91 77  Resp: 16 16 18 16  Temp: 97.9 F (36.6 C) 98.8 F (37.1 C) 98.1 F (36.7 C) 97.7 F (36.5 C)  TempSrc: Oral Oral Oral Oral  SpO2: 100%  100% 100%  Weight:      Height:       General: alert, cooperative and no distress Lochia: appropriate Uterine Fundus: firm Incision:  N/A DVT Evaluation: No evidence of DVT seen on physical exam. No cords or calf tenderness. No significant calf/ankle edema. Labs: Lab Results  Component Value Date   WBC 19.9 (H) 04/11/2020   HGB 8.9 (L) 04/11/2020   HCT 27.1 (L) 04/11/2020   MCV 78.3 (L) 04/11/2020   PLT 224 04/11/2020   CMP Latest Ref Rng & Units 12/07/2017  Glucose 65 - 99 mg/dL 83  BUN 6 - 20 mg/dL 9  Creatinine 0.44 - 1.00 mg/dL 0.70  Sodium 135 - 145 mmol/L 137  Potassium 3.5 - 5.1 mmol/L 4.4  Chloride 101 - 111 mmol/L 106  CO2 22 - 32 mmol/L -  Calcium 8.9 - 10.3 mg/dL -  Total Protein 6.5 - 8.1 g/dL -  Total Bilirubin 0.3 - 1.2 mg/dL -  Alkaline Phos 38 - 126 U/L -  AST 15 - 41 U/L -  ALT 14 - 54 U/L -   Edinburgh Score: Edinburgh Postnatal Depression Scale Screening Tool 04/10/2020  I have been able to laugh and see the funny side of things. 0  I have looked forward with enjoyment to things. 0  I have blamed myself unnecessarily when things went wrong. 1  I have been anxious or worried for no good reason. 1  I have felt scared or panicky for no good reason. 0  Things have been getting on top of me. 0  I have been so unhappy that I have   had difficulty sleeping. 0  I have felt sad or miserable. 0  I have been so unhappy that I have been crying. 0  The thought of harming myself has occurred to me. 0  Edinburgh Postnatal Depression Scale Total 2      After visit meds:  Allergies as of 04/12/2020   No Known Allergies     Medication List    STOP taking these medications   HYDROcodone-acetaminophen 5-325 MG tablet Commonly known as: NORCO/VICODIN     TAKE these medications   AeroChamber Plus inhaler Use as instructed   cetirizine 10 MG tablet Commonly known as: ZYRTEC Take 10 mg by mouth daily.   ibuprofen 800 MG tablet Commonly known as: ADVIL Take 1 tablet (800 mg total) by mouth every 8 (eight) hours as needed for mild pain, moderate pain or cramping. What changed:   medication  strength  how much to take  when to take this  reasons to take this   ProAir HFA 108 (90 Base) MCG/ACT inhaler Generic drug: albuterol Inhale 2 puffs into the lungs every 4 (four) hours as needed for shortness of breath.   Vitamin D (Ergocalciferol) 1.25 MG (50000 UNIT) Caps capsule Commonly known as: DRISDOL Take 50,000 Units by mouth every 7 (seven) days.        Discharge home in stable condition Infant Feeding: Bottle Infant Disposition:home with mother Discharge instruction: per After Visit Summary and Postpartum booklet. Activity: Advance as tolerated. Pelvic rest for 6 weeks.  Diet: routine diet Anticipated Birth Control: Abstinence Postpartum Appointment:6 weeks Additional Postpartum F/U: None Future Appointments:No future appointments. Follow up Visit: 6 week PPV with Dr. Cole at [facility]    04/12/2020 Melissa Davies, DO  

## 2020-04-12 NOTE — Plan of Care (Signed)
All discharge teaching and instructions given. Pt receptive.

## 2020-04-15 ENCOUNTER — Telehealth: Payer: Self-pay

## 2020-04-15 LAB — SURGICAL PATHOLOGY

## 2020-04-15 NOTE — Telephone Encounter (Signed)
Transition Care Management Follow-up Telephone Call  Date of discharge and from where: 04/12/2020 from [facility]  How have you been since you were released from the hospital? Patient states that her and the baby are feeling well and they are not having issues.   Any questions or concerns? No  Items Reviewed:  Did the pt receive and understand the discharge instructions provided? Yes   Medications obtained and verified? Yes   Any new allergies since your discharge? Yes   Dietary orders reviewed? Yes  Do you have support at home? Yes   Functional Questionnaire: (I = Independent and D = Dependent) ADLs: I  Bathing/Dressing- I  Meal Prep- I  Eating- I  Maintaining continence- I  Transferring/Ambulation- I  Managing Meds- I  Follow up appointments reviewed:   Specialist Hospital f/u appt confirmed? No  Patient is calling to schedule her PPV with OBGYN.  Are transportation arrangements needed? No   If their condition worsens, is the pt aware to call PCP or go to the Emergency Dept.? Yes  Was the patient provided with contact information for the PCP's office or ED? Yes  Was to pt encouraged to call back with questions or concerns? Yes

## 2020-05-20 DIAGNOSIS — J45909 Unspecified asthma, uncomplicated: Secondary | ICD-10-CM | POA: Diagnosis not present

## 2020-07-04 DIAGNOSIS — Z03818 Encounter for observation for suspected exposure to other biological agents ruled out: Secondary | ICD-10-CM | POA: Diagnosis not present

## 2020-10-21 DIAGNOSIS — Z01419 Encounter for gynecological examination (general) (routine) without abnormal findings: Secondary | ICD-10-CM | POA: Diagnosis not present

## 2020-10-21 DIAGNOSIS — Z0000 Encounter for general adult medical examination without abnormal findings: Secondary | ICD-10-CM | POA: Diagnosis not present

## 2020-12-22 ENCOUNTER — Ambulatory Visit (HOSPITAL_COMMUNITY)
Admission: EM | Admit: 2020-12-22 | Discharge: 2020-12-22 | Disposition: A | Discharge status: Home / Self Care | Payer: Medicaid Other | Attending: Internal Medicine | Admitting: Internal Medicine

## 2020-12-22 ENCOUNTER — Encounter (HOSPITAL_COMMUNITY): Payer: Self-pay

## 2020-12-22 DIAGNOSIS — J4521 Mild intermittent asthma with (acute) exacerbation: Secondary | ICD-10-CM

## 2020-12-22 DIAGNOSIS — R058 Other specified cough: Secondary | ICD-10-CM | POA: Diagnosis not present

## 2020-12-22 MED ORDER — BENZONATATE 100 MG PO CAPS: 100.0000 mg | ORAL_CAPSULE | Freq: Three times a day (TID) | ORAL | 0 refills | Status: DC

## 2020-12-22 MED ORDER — FLUTICASONE PROPIONATE 50 MCG/ACT NA SUSP: 1.0000 | Freq: Every day | NASAL | 0 refills | Status: DC

## 2020-12-22 MED ORDER — GUAIFENESIN ER 600 MG PO TB12: 600.0000 mg | ORAL_TABLET | Freq: Two times a day (BID) | ORAL | 0 refills | Status: AC

## 2020-12-22 NOTE — Discharge Instructions (Addendum)
Continue albuterol inhaler use If you have worsening symptoms, chest tightness or wheezing please return to urgent care to be reevaluated Your lung exam is clear without wheezing If your symptoms are persistent/recurrent, you may consider moving out of the apartment because of mold exposure.

## 2020-12-22 NOTE — ED Triage Notes (Signed)
Pt reports cough, sore throat, headache an chest tightness when coughing x 1 week. Pt states the symptoms are related to the mold in her apartment. Denies fever. OTC meds give no relief.

## 2020-12-23 NOTE — ED Provider Notes (Signed)
[facility]    CSN: [id number] Arrival date & time: 12/22/20  1741      History   Chief Complaint Chief Complaint  Patient presents with   Sore Throat   Cough   Headache    HPI Angie Mcdonald is a 20 y.o. female comes to the urgent care with 1 week history of sore throat, chest tightness, nonproductive cough and a headache.  Patient lives in an apartment that has mold and she believes that that is making her symptoms worse.  She has had same symptoms in a recurrent fashion over the past 6 weeks.  No fever or chills.  No nausea or vomiting.  Patient has been using her inhaler with no improvement in symptoms.  No shortness of breath at rest or on exertion.  Occasionally she may experience shortness of breath when she has a prolonged bout of coughing.   HPI  Past Medical History:  Diagnosis Date   Asthma    Environmental allergies    Vitamin D deficiency 11/26/2017    Patient Active Problem List   Diagnosis Date Noted   IUGR (intrauterine growth restriction) affecting care of mother 04/09/2020    History reviewed. No pertinent surgical history.  OB History     Gravida  1   Para  1   Term  1   Preterm      AB      Living  1      SAB      IAB      Ectopic      Multiple  0   Live Births  1            Home Medications    Prior to Admission medications   Medication Sig Start Date End Date Taking? Authorizing Provider  benzonatate (TESSALON) 100 MG capsule Take 1 capsule (100 mg total) by mouth every 8 (eight) hours. 12/22/20  Yes Nathali Vent, Britta Mccreedy, MD  fluticasone (FLONASE) 50 MCG/ACT nasal spray Place 1 spray into both nostrils daily. 12/22/20 01/21/21 Yes Swannie Milius, Britta Mccreedy, MD  guaiFENesin (MUCINEX) 600 MG 12 hr tablet Take 1 tablet (600 mg total) by mouth 2 (two) times daily for 10 days. 12/22/20 01/01/21 Yes Faithanne Verret, Britta Mccreedy, MD  cetirizine (ZYRTEC) 10 MG tablet Take 10 mg by mouth daily.    [provider]  ibuprofen (ADVIL)  800 MG tablet Take 1 tablet (800 mg total) by mouth every 8 (eight) hours as needed for mild pain, moderate pain or cramping. 04/12/20   Steva Ready, DO  PROAIR HFA 108 [id number] Base) MCG/ACT inhaler Inhale 2 puffs into the lungs every 4 (four) hours as needed for shortness of breath. 08/24/17   [provider]  Spacer/Aero-Holding Chambers (AEROCHAMBER PLUS) inhaler Use as instructed 05/25/16   Domenick Gong, MD  Vitamin D, Ergocalciferol, (DRISDOL) 50000 units CAPS capsule Take 50,000 Units by mouth every 7 (seven) days. 11/19/17   [provider]    Family History Family History  Problem Relation Age of Onset   Healthy Mother    Cancer Father    Diabetes Neg Hx    Heart failure Neg Hx     Social History Social History   Tobacco Use   Smoking status: Former    Pack years: 0.00    Types: Cigars   Smokeless tobacco: Never  Vaping Use   Vaping Use: Never used  Substance Use Topics   Alcohol use: Never   Drug use: Not Currently  Allergies   Patient has no known allergies.   Review of Systems Review of Systems  Constitutional: Negative.   HENT:  Positive for congestion, postnasal drip, rhinorrhea and sore throat. Negative for ear discharge and ear pain.   Respiratory:  Positive for cough. Negative for shortness of breath and wheezing.   Gastrointestinal:  Positive for nausea. Negative for vomiting.  Genitourinary: Negative.     Physical Exam Triage Vital Signs ED Triage Vitals  Enc Vitals Group     BP 12/22/20 1843 122/79     Pulse Rate 12/22/20 1843 75     Resp 12/22/20 1843 18     Temp 12/22/20 1843 98.5 F (36.9 C)     Temp Source 12/22/20 1843 Oral     SpO2 12/22/20 1843 100 %     Weight --      Height --      Head Circumference --      Peak Flow --      Pain Score 12/22/20 1842 0     Pain Loc --      Pain Edu? --      Excl. in GC? --    No data found.  Updated Vital Signs BP 122/79 (BP Location: Right Wrist)   Pulse 75   Temp  98.5 F (36.9 C) (Oral)   Resp 18   LMP  (Within Months) Comment: 1 month  SpO2 100%   Breastfeeding No   Visual Acuity Right Eye Distance:   Left Eye Distance:   Bilateral Distance:    Right Eye Near:   Left Eye Near:    Bilateral Near:     Physical Exam Constitutional:      General: She is not in acute distress.    Appearance: She is well-developed. She is obese. She is not ill-appearing.  HENT:     Right Ear: Tympanic membrane normal.     Left Ear: Tympanic membrane normal.     Mouth/Throat:     Mouth: Mucous membranes are moist. No oral lesions.     Pharynx: No posterior oropharyngeal erythema.  Cardiovascular:     Rate and Rhythm: Normal rate and regular rhythm.  Pulmonary:     Effort: Pulmonary effort is normal.     Breath sounds: No wheezing, rhonchi or rales.  Abdominal:     General: Bowel sounds are normal.     Palpations: Abdomen is soft.  Skin:    Capillary Refill: Capillary refill takes less than 2 seconds.  Neurological:     Mental Status: She is alert.     UC Treatments / Results  Labs (all labs ordered are listed, but only abnormal results are displayed) Labs Reviewed - No data to display  EKG   Radiology No results found.  Procedures Procedures (including critical care time)  Medications Ordered in UC Medications - No data to display  Initial Impression / Assessment and Plan / UC Course  I have reviewed the triage vital signs and the nursing notes.  Pertinent labs & imaging results that were available during my care of the patient were reviewed by me and considered in my medical decision making (see chart for details).     1.  Mild intermittent asthma with acute exacerbation secondary to mold exposure: Albuterol use is recommended Tessalon Perles as needed for cough Flonase Mucinex twice daily Lung exam is clear-no imaging indicated If symptoms worsen please return to urgent care to be reevaluated If mold in the apartment is  causing  worsening symptoms you might want to consider changing apartments.  Final Clinical Impressions(s) / UC Diagnoses   Final diagnoses:  Cough productive of yellow sputum  Mild intermittent asthma with acute exacerbation     Discharge Instructions      Continue albuterol inhaler use If you have worsening symptoms, chest tightness or wheezing please return to urgent care to be reevaluated Your lung exam is clear without wheezing If your symptoms are persistent/recurrent, you may consider moving out of the apartment because of mold exposure.   ED Prescriptions     Medication Sig Dispense Auth. Provider   guaiFENesin (MUCINEX) 600 MG 12 hr tablet Take 1 tablet (600 mg total) by mouth 2 (two) times daily for 10 days. 20 tablet Makinsey Pepitone, Britta Mccreedy, MD   benzonatate (TESSALON) 100 MG capsule Take 1 capsule (100 mg total) by mouth every 8 (eight) hours. 21 capsule Juanangel Soderholm, Britta Mccreedy, MD   fluticasone (FLONASE) 50 MCG/ACT nasal spray Place 1 spray into both nostrils daily. 16 g Ashya Nicolaisen, Britta Mccreedy, MD      PDMP not reviewed this encounter.   Merrilee Jansky, MD 12/23/20 [phone]

## 2021-02-03 ENCOUNTER — Encounter (HOSPITAL_COMMUNITY): Payer: Self-pay | Admitting: *Deleted

## 2021-02-03 ENCOUNTER — Other Ambulatory Visit: Payer: Self-pay

## 2021-02-03 ENCOUNTER — Ambulatory Visit (HOSPITAL_COMMUNITY)
Admission: EM | Admit: 2021-02-03 | Discharge: 2021-02-03 | Disposition: A | Discharge status: Home / Self Care | Payer: Medicaid Other | Attending: Sports Medicine | Admitting: Sports Medicine

## 2021-02-03 DIAGNOSIS — H66003 Acute suppurative otitis media without spontaneous rupture of ear drum, bilateral: Secondary | ICD-10-CM | POA: Insufficient documentation

## 2021-02-03 DIAGNOSIS — R059 Cough, unspecified: Secondary | ICD-10-CM | POA: Insufficient documentation

## 2021-02-03 DIAGNOSIS — R519 Headache, unspecified: Secondary | ICD-10-CM | POA: Diagnosis not present

## 2021-02-03 DIAGNOSIS — J029 Acute pharyngitis, unspecified: Secondary | ICD-10-CM | POA: Insufficient documentation

## 2021-02-03 DIAGNOSIS — R509 Fever, unspecified: Secondary | ICD-10-CM | POA: Diagnosis not present

## 2021-02-03 DIAGNOSIS — Z20822 Contact with and (suspected) exposure to covid-19: Secondary | ICD-10-CM | POA: Diagnosis not present

## 2021-02-03 DIAGNOSIS — Z79899 Other long term (current) drug therapy: Secondary | ICD-10-CM | POA: Diagnosis not present

## 2021-02-03 LAB — POC INFLUENZA A AND B ANTIGEN (URGENT CARE ONLY)
INFLUENZA A ANTIGEN, POC: NEGATIVE
INFLUENZA B ANTIGEN, POC: NEGATIVE

## 2021-02-03 LAB — POCT RAPID STREP A, ED / UC: Streptococcus, Group A Screen (Direct): NEGATIVE

## 2021-02-03 MED ORDER — IBUPROFEN 800 MG PO TABS
ORAL_TABLET | ORAL | Status: AC
  Filled 2021-02-03: qty 1

## 2021-02-03 MED ORDER — IBUPROFEN 800 MG PO TABS
800.0000 mg | ORAL_TABLET | Freq: Once | ORAL | Status: AC
  Administered 2021-02-03: 800 mg via ORAL

## 2021-02-03 MED ORDER — AZITHROMYCIN 250 MG PO TABS: 250.0000 mg | ORAL_TABLET | Freq: Every day | ORAL | 0 refills | Status: AC

## 2021-02-03 NOTE — Discharge Instructions (Addendum)
Encourage vigorous fluid hydration, drink at least 6-8 bottles of water a day  Alternating ibuprofen and Tylenol to stay ahead of the fever and help with the throat pain  Antibiotic; azithromycin, take for the next 5 days until completion

## 2021-02-03 NOTE — ED Provider Notes (Signed)
[facility]    CSN: [id number] Arrival date & time: 02/03/21  1752      History   Chief Complaint Chief Complaint  Patient presents with   Fever   Sore Throat   Otalgia    HPI Angie Mcdonald is a 20 y.o. female who presents with subjective fever/chills, chest congestion, sore throat, ear pain x 2 days.  Patient reports that starting yesterday she started feeling somewhat ill. The pain started in her throat, as a burning sensation.  She now has pain in both of her ears. She states that since yesterday she has some chest congestion and is coughing up yellow mucus.  She does have some pain with coughing, however denies any chest pain or shortness of breath.  Because of her sore throat, she has had limited p.o. intake over the last 2 days.  She denies any known sick contacts or COVID contacts.  She states she is immunized towards COVID.  She denies any runny nose or sinus congestion.  She reports headache, frontal headache that is throbbing.  She denies any abdominal pain. She has been taking Tessalon Perles for the cough, but with minimal relief.  Otherwise denies taking any other medications.    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 No pertinent surgical history.  OB History     Gravida  1   Para  1   Term  1   Preterm      AB      Living  1      SAB      IAB      Ectopic      Multiple  0   Live Births  1            Home Medications    Prior to Admission medications   Medication Sig Start Date End Date Taking? Authorizing Provider  azithromycin (ZITHROMAX) 250 MG tablet Take 1 tablet (250 mg total) by mouth daily for 5 days. Take first 2 tablets together, then 1 every day until finished. 02/03/21 02/08/21 Yes Madelyn Brunner, DO  benzonatate (TESSALON) 100 MG capsule Take 1 capsule (100 mg total) by mouth every 8 (eight) hours. 12/22/20  Yes Lamptey, Britta Mccreedy, MD  cetirizine (ZYRTEC) 10 MG tablet Take 10 mg by mouth daily.   Yes [provider]  fluticasone (FLONASE) 50 MCG/ACT nasal spray Place 1 spray into both nostrils daily. 12/22/20 02/03/21 Yes Lamptey, Britta Mccreedy, MD  PROAIR HFA 108 [phone] Base) MCG/ACT inhaler Inhale 2 puffs into the lungs every 4 (four) hours as needed for shortness of breath. 08/24/17  Yes [provider]  Spacer/Aero-Holding Chambers (AEROCHAMBER PLUS) inhaler Use as instructed 05/25/16  Yes Domenick Gong, MD  Vitamin D, Ergocalciferol, (DRISDOL) 50000 units CAPS capsule Take 50,000 Units by mouth every 7 (seven) days. 11/19/17  Yes [provider]  ibuprofen (ADVIL) 800 MG tablet Take 1 tablet (800 mg total) by mouth every 8 (eight) hours as needed for mild pain, moderate pain or cramping. 04/12/20   Steva Ready, DO    Family History Family History  Problem Relation Age of Onset   Healthy Mother    Cancer Father    Diabetes Neg Hx    Heart failure  Neg Hx     Social History Social History   Tobacco Use   Smoking status: Former    Types: Cigars   Smokeless tobacco: Never  Vaping Use   Vaping Use: Never used  Substance Use Topics   Alcohol use: Never   Drug use: Not Currently     Allergies   Patient has no known allergies.   Review of Systems Review of Systems  Constitutional:  Positive for activity change, appetite change, chills, fatigue and fever.  HENT:  Positive for ear pain, sore throat and trouble swallowing. Negative for congestion and hearing loss.   Eyes:  Negative for visual disturbance.  Respiratory:  Positive for cough. Negative for shortness of breath and wheezing.   Cardiovascular:  Negative for chest pain.  Gastrointestinal:  Negative for abdominal pain.  Musculoskeletal:  Negative for myalgias.  Skin:  Negative for rash.   Neurological:  Positive for weakness. Negative for dizziness.    Physical Exam Triage Vital Signs ED Triage Vitals  Enc Vitals Group     BP 02/03/21 1844 107/76     Pulse Rate 02/03/21 1844 (!) 120     Resp 02/03/21 1844 20     Temp 02/03/21 1844 (!) 101.6 F (38.7 C)     Temp src --      SpO2 02/03/21 1844 100 %     Weight --      Height --      Head Circumference --      Peak Flow --      Pain Score 02/03/21 1845 9     Pain Loc --      Pain Edu? --      Excl. in GC? --    No data found.  Updated Vital Signs BP 107/76   Pulse (!) 120   Temp (!) 101.6 F (38.7 C)   Resp 20   LMP 01/15/2021   SpO2 100%    Physical Exam Constitutional:      Appearance: She is ill-appearing. She is not toxic-appearing.  HENT:     Head: Normocephalic and atraumatic.     Right Ear: Tympanic membrane is erythematous.     Left Ear: Tympanic membrane is erythematous.     Nose: No congestion.     Comments: + erythema of middle nasal turbinates    Mouth/Throat:     Mouth: Mucous membranes are moist. No oral lesions.     Pharynx: Posterior oropharyngeal erythema present. No oropharyngeal exudate or uvula swelling.  Cardiovascular:     Rate and Rhythm: Tachycardia present.     Heart sounds: Normal heart sounds.  Pulmonary:     Effort: Pulmonary effort is normal. No respiratory distress.     Breath sounds: No wheezing or rales.  Abdominal:     Palpations: Abdomen is soft.  Musculoskeletal:     Cervical back: Normal range of motion and neck supple.  Skin:    General: Skin is warm.     Capillary Refill: Capillary refill takes less than 2 seconds.  Neurological:     Mental Status: She is alert.     UC Treatments / Results  Labs (all labs ordered are listed, but only abnormal results are displayed) Labs Reviewed  SARS CORONAVIRUS 2 (TAT 6-24 HRS)  POC INFLUENZA A AND B ANTIGEN (URGENT CARE ONLY)  POCT RAPID STREP A, ED / UC    Radiology No results  found.  Procedures Procedures (including critical care time)  Medications Ordered in UC  Medications  ibuprofen (ADVIL) tablet 800 mg (800 mg Oral Given 02/03/21 1934)    Initial Impression / Assessment and Plan / UC Course  I have reviewed the triage vital signs and the nursing notes.  Pertinent labs & imaging results that were available during my care of the patient were reviewed by me and considered in my medical decision making (see chart for details).     Patient is ill-appearing with temperature of 101.6 F, pulse rate initially 120 bpm. Her rapid strep was negative. Influenza returned negative as well. She has an etiology of pharyngitis, however b/l TM's were erythematous and she does appear to have a component of acute otitis media. She was swabbed for COVID as well, discussed quarantine herself until this returns, we will call with results. Given her productive cough and b/l AO,M I will treat with Azithromycin. Instructed patient to reduce fever with anti-pyretics with ibuprofen or tylenol. She was encouraged to push fluids to maintain hydration. Strict return precautions were provided. She states she does follow with [facility] medicine, to call if symptoms not improved for appt.  Final Clinical Impressions(s) / UC Diagnoses   Final diagnoses:  Cough  Fever, unspecified  Non-recurrent acute suppurative otitis media of both ears without spontaneous rupture of tympanic membranes  Pharyngitis, unspecified etiology     Discharge Instructions      Encourage vigorous fluid hydration, drink at least 6-8 bottles of water a day  Alternating ibuprofen and Tylenol to stay ahead of the fever and help with the throat pain  Antibiotic; azithromycin, take for the next 5 days until completion     ED Prescriptions     Medication Sig Dispense Auth. Provider   azithromycin (ZITHROMAX) 250 MG tablet Take 1 tablet (250 mg total) by mouth daily for 5 days. Take first 2 tablets  together, then 1 every day until finished. 6 tablet Madelyn Brunner, DO      PDMP not reviewed this encounter.   Madelyn Brunner, DO 02/03/21 2011

## 2021-02-03 NOTE — ED Triage Notes (Signed)
Pt has fever 101.6 ,sore throat and bil ear pain.

## 2021-02-04 LAB — SARS CORONAVIRUS 2 (TAT 6-24 HRS): SARS Coronavirus 2: NEGATIVE

## 2021-02-06 LAB — CULTURE, GROUP A STREP (THRC)

## 2021-08-25 DIAGNOSIS — N911 Secondary amenorrhea: Secondary | ICD-10-CM | POA: Diagnosis not present

## 2021-08-25 DIAGNOSIS — R300 Dysuria: Secondary | ICD-10-CM | POA: Diagnosis not present

## 2021-09-19 ENCOUNTER — Encounter (HOSPITAL_COMMUNITY): Payer: Self-pay | Admitting: Emergency Medicine

## 2021-09-19 ENCOUNTER — Ambulatory Visit (HOSPITAL_COMMUNITY)
Admission: EM | Admit: 2021-09-19 | Discharge: 2021-09-19 | Disposition: A | Discharge status: Home / Self Care | Payer: Medicaid Other | Attending: Emergency Medicine | Admitting: Emergency Medicine

## 2021-09-19 DIAGNOSIS — J069 Acute upper respiratory infection, unspecified: Secondary | ICD-10-CM | POA: Diagnosis not present

## 2021-09-19 DIAGNOSIS — Z20822 Contact with and (suspected) exposure to covid-19: Secondary | ICD-10-CM | POA: Diagnosis not present

## 2021-09-19 LAB — POCT RAPID STREP A, ED / UC: Streptococcus, Group A Screen (Direct): NEGATIVE

## 2021-09-19 LAB — SARS CORONAVIRUS 2 (TAT 6-24 HRS): SARS Coronavirus 2: NEGATIVE

## 2021-09-19 MED ORDER — LIDOCAINE VISCOUS HCL 2 % MT SOLN: 15.0000 mL | OROMUCOSAL | 0 refills | Status: DC | PRN

## 2021-09-19 NOTE — ED Provider Notes (Signed)
?[facility] ? ? ? ?CSN: [id number] ?Arrival date & time: 09/19/21  1055 ? ? ?  ? ?History   ?Chief Complaint ?Chief Complaint  ?Patient presents with  ? Otalgia  ? Sore Throat  ? ? ?HPI ?Angie Mcdonald is a 21 y.o. female.  ? ?Patient presents with fever, chills and bilateral ear pain for 5 days, sore throat for 4 days and rhinorrhea beginning today.  Decreased appetite but tolerating fluids.  Known sick contacts.  Has attempted use of Tylenol and ibuprofen which has been effective in fever management.   ? ? ?Past Medical History:  ?Diagnosis Date  ? Asthma   ? Environmental allergies   ? Vitamin D deficiency 11/26/2017  ? ? ?Patient Active Problem List  ? Diagnosis Date Noted  ? IUGR (intrauterine growth restriction) affecting care of mother 04/09/2020  ? ? ?History reviewed. No pertinent surgical history. ? ?OB History   ? ? Gravida  ?1  ? Para  ?1  ? Term  ?1  ? Preterm  ?   ? AB  ?   ? Living  ?1  ?  ? ? SAB  ?   ? IAB  ?   ? Ectopic  ?   ? Multiple  ?0  ? Live Births  ?1  ?   ?  ?  ? ? ? ?Home Medications   ? ?Prior to Admission medications   ?Medication Sig Start Date End Date Taking? Authorizing Provider  ?benzonatate (TESSALON) 100 MG capsule Take 1 capsule (100 mg total) by mouth every 8 (eight) hours. 12/22/20   LampteyBritta Mccreedy, MD  ?cetirizine (ZYRTEC) 10 MG tablet Take 10 mg by mouth daily.    [provider]  ?fluticasone (FLONASE) 50 MCG/ACT nasal spray Place 1 spray into both nostrils daily. 12/22/20 02/03/21  Merrilee Jansky, MD  ?ibuprofen (ADVIL) 800 MG tablet Take 1 tablet (800 mg total) by mouth every 8 (eight) hours as needed for mild pain, moderate pain or cramping. 04/12/20   Steva Ready, DO  ?PROAIR HFA 108 [phone] Base) MCG/ACT inhaler Inhale 2 puffs into the lungs every 4 (four) hours as needed for shortness of breath. 08/24/17   [provider]  ?Spacer/Aero-Holding Chambers (AEROCHAMBER PLUS) inhaler Use as instructed 05/25/16   Domenick Gong, MD  ?Vitamin D,  Ergocalciferol, (DRISDOL) 50000 units CAPS capsule Take 50,000 Units by mouth every 7 (seven) days. 11/19/17   [provider]  ? ? ?Family History ?Family History  ?Problem Relation Age of Onset  ? Healthy Mother   ? Cancer Father   ? Diabetes Neg Hx   ? Heart failure Neg Hx   ? ? ?Social History ?Social History  ? ?Tobacco Use  ? Smoking status: Former  ?  Types: Cigars  ? Smokeless tobacco: Never  ?Vaping Use  ? Vaping Use: Never used  ?Substance Use Topics  ? Alcohol use: Never  ? Drug use: Not Currently  ? ? ? ?Allergies   ?Patient has no known allergies. ? ? ?Review of Systems ?Review of Systems  ?Constitutional:  Positive for appetite change, chills and fever. Negative for activity change, diaphoresis, fatigue and unexpected weight change.  ?HENT:  Positive for ear pain, rhinorrhea and sore throat. Negative for congestion, dental problem, drooling, ear discharge, facial swelling, hearing loss, mouth sores, nosebleeds, postnasal drip, sinus pressure, sinus pain, sneezing, tinnitus, trouble swallowing and voice change.   ?Respiratory: Negative.    ?Cardiovascular: Negative.   ?Gastrointestinal: Negative.   ?  Neurological: Negative.   ? ? ?Physical Exam ?Triage Vital Signs ?ED Triage Vitals  ?Enc Vitals Group  ?   BP 09/19/21 1103 132/77  ?   Pulse Rate 09/19/21 1103 (!) 102  ?   Resp 09/19/21 1103 18  ?   Temp 09/19/21 1103 98.5 ?F (36.9 ?C)  ?   Temp Source 09/19/21 1103 Oral  ?   SpO2 09/19/21 1103 98 %  ?   Weight --   ?   Height --   ?   Head Circumference --   ?   Peak Flow --   ?   Pain Score 09/19/21 1102 8  ?   Pain Loc --   ?   Pain Edu? --   ?   Excl. in GC? --   ? ?No data found. ? ?Updated Vital Signs ?BP 132/77 (BP Location: Left Arm)   Pulse (!) 102   Temp 98.5 ?F (36.9 ?C) (Oral)   Resp 18   LMP 08/25/2021   SpO2 98%  ? ?Visual Acuity ?Right Eye Distance:   ?Left Eye Distance:   ?Bilateral Distance:   ? ?Right Eye Near:   ?Left Eye Near:    ?Bilateral Near:    ? ?Physical  Exam ?Constitutional:   ?   Appearance: She is well-developed.  ?HENT:  ?   Head: Normocephalic.  ?   Right Ear: Tympanic membrane and ear canal normal.  ?   Left Ear: Tympanic membrane and ear canal normal.  ?   Nose: Congestion and rhinorrhea present.  ?   Mouth/Throat:  ?   Mouth: Mucous membranes are moist.  ?   Pharynx: Posterior oropharyngeal erythema present.  ?   Tonsils: Tonsillar exudate present. 0 on the right. 0 on the left.  ?Cardiovascular:  ?   Rate and Rhythm: Normal rate and regular rhythm.  ?   Heart sounds: Normal heart sounds.  ?Pulmonary:  ?   Effort: Pulmonary effort is normal.  ?   Breath sounds: Normal breath sounds.  ?Musculoskeletal:  ?   Cervical back: Normal range of motion and neck supple.  ?Skin: ?   General: Skin is warm and dry.  ?Neurological:  ?   General: No focal deficit present.  ?   Mental Status: She is alert and oriented to person, place, and time.  ?Psychiatric:     ?   Mood and Affect: Mood normal.     ?   Behavior: Behavior normal.  ? ? ? ?UC Treatments / Results  ?Labs ?(all labs ordered are listed, but only abnormal results are displayed) ?Labs Reviewed - No data to display ? ?EKG ? ? ?Radiology ?No results found. ? ?Procedures ?Procedures (including critical care time) ? ?Medications Ordered in UC ?Medications - No data to display ? ?Initial Impression / Assessment and Plan / UC Course  ?I have reviewed the triage vital signs and the nursing notes. ? ?Pertinent labs & imaging results that were available during my care of the patient were reviewed by me and considered in my medical decision making (see chart for details). ? ?Viral upper respiratory infection ? ?Vital signs are stable, patient is in no signs of distress, okay for outpatient treatment, strep PCR negative, sent for culture, discussed findings with patient, COVID test pending, lidocaine viscous sent to pharmacy as sore throat is most worrisome symptom, declined ibuprofen prescription, may attempt salt water  gargles, throat lozenges, warm liquids for further supportive care, may continue use of over-the-counter  medications in addition, urgent care follow-up as needed ?Final Clinical Impressions(s) / UC Diagnoses  ? ?Final diagnoses:  ?None  ? ?Discharge Instructions   ?None ?  ? ?ED Prescriptions   ?None ?  ? ?PDMP not reviewed this encounter. ?  ?Valinda Hoar, NP ?09/19/21 1144 ? ?

## 2021-09-19 NOTE — ED Triage Notes (Signed)
Having bilat ear pain since Monday, Tuesday started having sore throat. Taking ibuprofen.  ?

## 2021-09-19 NOTE — Discharge Instructions (Addendum)
Your symptoms today are most likely being caused by a virus and should steadily improve in time it can take up to 7 to 10 days before you truly start to see a turnaround however things will get better ? ?Strep test negative ?Covid test pending, you will be notified of positive results  ?   ?You can take Tylenol and/or Ibuprofen as needed for fever reduction and pain relief. ?  ?For cough: honey 1/2 to 1 teaspoon (you can dilute the honey in water or another fluid).  You can also use guaifenesin and dextromethorphan for cough. You can use a humidifier for chest congestion and cough.  If you don't have a humidifier, you can sit in the bathroom with the hot shower running.    ?  ?For sore throat: try warm salt water gargles, cepacol lozenges, throat spray, warm tea or water with lemon/honey, popsicles or ice, or OTC cold relief medicine for throat discomfort. ?  ?For congestion: take a daily anti-histamine like Zyrtec, Claritin, and a oral decongestant, such as pseudoephedrine.  You can also use Flonase 1-2 sprays in each nostril daily. ?  ?It is important to stay hydrated: drink plenty of fluids (water, gatorade/powerade/pedialyte, juices, or teas) to keep your throat moisturized and help further relieve irritation/discomfort.  ?

## 2021-09-21 ENCOUNTER — Ambulatory Visit (HOSPITAL_COMMUNITY)
Admission: EM | Admit: 2021-09-21 | Discharge: 2021-09-21 | Disposition: A | Discharge status: Home / Self Care | Payer: Medicaid Other | Attending: Emergency Medicine | Admitting: Emergency Medicine

## 2021-09-21 ENCOUNTER — Other Ambulatory Visit: Payer: Self-pay

## 2021-09-21 ENCOUNTER — Encounter (HOSPITAL_COMMUNITY): Payer: Self-pay | Admitting: Emergency Medicine

## 2021-09-21 DIAGNOSIS — J358 Other chronic diseases of tonsils and adenoids: Secondary | ICD-10-CM | POA: Diagnosis not present

## 2021-09-21 DIAGNOSIS — J029 Acute pharyngitis, unspecified: Secondary | ICD-10-CM | POA: Diagnosis not present

## 2021-09-21 DIAGNOSIS — J3089 Other allergic rhinitis: Secondary | ICD-10-CM | POA: Diagnosis not present

## 2021-09-21 DIAGNOSIS — K141 Geographic tongue: Secondary | ICD-10-CM

## 2021-09-21 DIAGNOSIS — J039 Acute tonsillitis, unspecified: Secondary | ICD-10-CM

## 2021-09-21 DIAGNOSIS — J302 Other seasonal allergic rhinitis: Secondary | ICD-10-CM

## 2021-09-21 LAB — POCT INFECTIOUS MONO SCREEN, ED / UC: Mono Screen: NEGATIVE

## 2021-09-21 LAB — POC INFLUENZA A AND B ANTIGEN (URGENT CARE ONLY)
INFLUENZA A ANTIGEN, POC: NEGATIVE
INFLUENZA B ANTIGEN, POC: NEGATIVE

## 2021-09-21 LAB — CULTURE, GROUP A STREP (THRC)

## 2021-09-21 LAB — POCT RAPID STREP A, ED / UC: Streptococcus, Group A Screen (Direct): NEGATIVE

## 2021-09-21 MED ORDER — CETIRIZINE HCL 10 MG PO TABS
10.0000 mg | ORAL_TABLET | Freq: Every day | ORAL | 1 refills | Status: AC
Start: 2021-09-21 — End: 2022-03-20

## 2021-09-21 MED ORDER — LIDOCAINE VISCOUS HCL 2 % MT SOLN
15.0000 mL | Freq: Four times a day (QID) | OROMUCOSAL | 0 refills | Status: AC | PRN
Start: 2021-09-21 — End: ?

## 2021-09-21 MED ORDER — IPRATROPIUM BROMIDE 0.06 % NA SOLN: 2.0000 | Freq: Three times a day (TID) | NASAL | 2 refills | Status: AC

## 2021-09-21 MED ORDER — CEFDINIR 300 MG PO CAPS: 300.0000 mg | ORAL_CAPSULE | Freq: Two times a day (BID) | ORAL | 0 refills | Status: AC

## 2021-09-21 MED ORDER — FLUTICASONE PROPIONATE 50 MCG/ACT NA SUSP: 2.0000 | Freq: Every day | NASAL | 1 refills | Status: AC

## 2021-09-21 NOTE — ED Provider Notes (Signed)
?[facility] ? ? ? ?CSN: [id number] ?Arrival date & time: 09/21/21  1429 ?  ? ?HISTORY  ? ?Chief Complaint  ?Patient presents with  ? Sore Throat  ? ?HPI ?Angie Mcdonald is a 21 y.o. female. Patient is here with mother and little sister today.  Patient complains of throat pain, pain in both ears and fatigue.  Patient was seen 2 days ago for the same complaint.  Patient had a negative COVID-19 test, negative rapid strep test and negative throat culture. Patient was given a prescription for viscous lidocaine.  Patient states that pain in her throat has continued to worsen and that the ear pain is new.  Patient reports having unprotected oral sex about 3 weeks ago but states she has not been advised of that partner testing positive for an STD.  Patient reports epigastric pain that radiates to both upper quadrants, denies heartburn.  Patient states she has a decreased appetite because it is so painful to swallow.  Patient's mother states she would like something for her to swish and spit to relieve her symptoms.  Patient has a mildly elevated temperature with a heart rate of 114 on arrival today. ? ?The history is provided by the patient.  ?Past Medical History:  ?Diagnosis Date  ? Asthma   ? Environmental allergies   ? Vitamin D deficiency 11/26/2017  ? ?Patient Active Problem List  ? Diagnosis Date Noted  ? IUGR (intrauterine growth restriction) affecting care of mother 04/09/2020  ? ?History reviewed. No pertinent surgical history. ?OB History   ? ? Gravida  ?1  ? Para  ?1  ? Term  ?1  ? Preterm  ?   ? AB  ?   ? Living  ?1  ?  ? ? SAB  ?   ? IAB  ?   ? Ectopic  ?   ? Multiple  ?0  ? Live Births  ?1  ?   ?  ?  ? ?Home Medications   ? ?Prior to Admission medications   ?Medication Sig Start Date End Date Taking? Authorizing Provider  ?cetirizine (ZYRTEC) 10 MG tablet Take 10 mg by mouth daily.    [provider]  ?ibuprofen (ADVIL) 800 MG tablet Take 1 tablet (800 mg total) by mouth every 8 (eight)  hours as needed for mild pain, moderate pain or cramping. 04/12/20   Steva Readyavies, Melissa, DO  ?lidocaine (XYLOCAINE) 2 % solution Use as directed 15 mLs in the mouth or throat as needed for mouth pain. 09/19/21   Valinda HoarWhite, Adrienne R, NP  ?PROAIR HFA 108 (90 Base) MCG/ACT inhaler Inhale 2 puffs into the lungs every 4 (four) hours as needed for shortness of breath. 08/24/17   [provider]  ?Spacer/Aero-Holding Chambers (AEROCHAMBER PLUS) inhaler Use as instructed 05/25/16   Domenick GongMortenson, Laurencia, MD  ? ?Family History ?Family History  ?Problem Relation Age of Onset  ? Healthy Mother   ? Cancer Father   ? Diabetes Neg Hx   ? Heart failure Neg Hx   ? ?Social History ?Social History  ? ?Tobacco Use  ? Smoking status: Former  ?  Types: Cigars  ? Smokeless tobacco: Never  ?Vaping Use  ? Vaping Use: Never used  ?Substance Use Topics  ? Alcohol use: Never  ? Drug use: Not Currently  ? ?Allergies   ?Patient has no known allergies. ? ?Review of Systems ?Review of Systems ?Pertinent findings noted in history of present illness.  ? ?Physical Exam ?Triage Vital  Signs ?ED Triage Vitals  ?Enc Vitals Group  ?   BP 05/02/21 0827 (!) 147/82  ?   Pulse Rate 05/02/21 0827 72  ?   Resp 05/02/21 0827 18  ?   Temp 05/02/21 0827 98.3 ?F (36.8 ?C)  ?   Temp Source 05/02/21 0827 Oral  ?   SpO2 05/02/21 0827 98 %  ?   Weight --   ?   Height --   ?   Head Circumference --   ?   Peak Flow --   ?   Pain Score 05/02/21 0826 5  ?   Pain Loc --   ?   Pain Edu? --   ?   Excl. in GC? --   ?No data found. ? ?Updated Vital Signs ?BP 100/74 (BP Location: Right Arm)   Pulse (!) 114   Temp 99.2 ?F (37.3 ?C) (Oral)   Resp 18   Ht 4\' 9"  (1.448 m)   Wt 220 lb 0.3 oz (99.8 kg)   LMP 08/25/2021   SpO2 95%   BMI 47.61 kg/m?  ? ?Physical Exam ?Vitals and nursing note reviewed. Exam conducted with a chaperone present.  ?Constitutional:   ?   General: She is awake. She is not in acute distress. ?   Appearance: Normal appearance. She is well-developed. She is  morbidly obese. She is ill-appearing. She is not toxic-appearing.  ?HENT:  ?   Head: Normocephalic and atraumatic.  ?   Salivary Glands: Right salivary gland is diffusely enlarged and tender. Left salivary gland is diffusely enlarged and tender.  ?   Right Ear: Hearing and external ear normal.  ?   Left Ear: Hearing and external ear normal.  ?   Ears:  ?   Comments: Bilateral EACs with mild erythema, left TM bulging with clear fluid, right TM bulging with suppurative fluid. ?   Nose: Mucosal edema, congestion and rhinorrhea present. Rhinorrhea is clear.  ?   Right Turbinates: Enlarged, swollen and pale.  ?   Left Turbinates: Enlarged, swollen and pale.  ?   Right Sinus: No maxillary sinus tenderness or frontal sinus tenderness.  ?   Left Sinus: No maxillary sinus tenderness or frontal sinus tenderness.  ?   Mouth/Throat:  ?   Lips: Pink. No lesions.  ?   Mouth: Mucous membranes are moist. No oral lesions or angioedema.  ?   Dentition: No gingival swelling.  ?   Tongue: Lesions present.  ?   Palate: No mass and lesions.  ?   Pharynx: Uvula midline. Pharyngeal swelling, oropharyngeal exudate and posterior oropharyngeal erythema present. No uvula swelling.  ?   Tonsils: Tonsillar exudate present. 4+ on the right. 3+ on the left.  ?Eyes:  ?   General: Lids are normal. Vision grossly intact.  ?   Extraocular Movements: Extraocular movements intact.  ?   Conjunctiva/sclera: Conjunctivae normal.  ?   Right eye: Right conjunctiva is not injected. No exudate. ?   Left eye: Left conjunctiva is not injected. No exudate. ?   Pupils: Pupils are equal, round, and reactive to light.  ?Neck:  ?   Thyroid: No thyroid mass, thyromegaly or thyroid tenderness.  ?   Trachea: Tracheal tenderness present. No abnormal tracheal secretions or tracheal deviation.  ?   Comments: Voice is muffled ?Cardiovascular:  ?   Rate and Rhythm: Regular rhythm. Tachycardia present.  ?   Pulses: Normal pulses.  ?   Heart sounds: Normal heart sounds, S1  normal and S2 normal. No murmur heard. ?  No friction rub. No gallop.  ?Pulmonary:  ?   Effort: Pulmonary effort is normal. No tachypnea, bradypnea, accessory muscle usage, prolonged expiration, respiratory distress or retractions.  ?   Breath sounds: No stridor, decreased air movement or transmitted upper airway sounds. No decreased breath sounds, wheezing, rhonchi or rales.  ?Abdominal:  ?   General: Bowel sounds are normal.  ?   Palpations: Abdomen is soft.  ?   Tenderness: There is generalized abdominal tenderness. There is no right CVA tenderness, left CVA tenderness or rebound. Negative signs include Murphy's sign.  ?   Hernia: No hernia is present.  ?Musculoskeletal:     ?   General: No tenderness. Normal range of motion.  ?   Cervical back: Full passive range of motion without pain, normal range of motion and neck supple. No edema, erythema or rigidity. No pain with movement. Normal range of motion.  ?   Right lower leg: No edema.  ?   Left lower leg: No edema.  ?Lymphadenopathy:  ?   Cervical: Cervical adenopathy present.  ?   Right cervical: No superficial cervical adenopathy. ?   Left cervical: No superficial cervical adenopathy.  ?Skin: ?   General: Skin is warm and dry.  ?   Findings: No erythema, lesion or rash.  ?Neurological:  ?   General: No focal deficit present.  ?   Mental Status: She is alert and oriented to person, place, and time. Mental status is at baseline.  ?Psychiatric:     ?   Mood and Affect: Mood normal.     ?   Behavior: Behavior normal. Behavior is cooperative.     ?   Thought Content: Thought content normal.     ?   Judgment: Judgment normal.  ? ? ?Visual Acuity ?Right Eye Distance:   ?Left Eye Distance:   ?Bilateral Distance:   ? ?Right Eye Near:   ?Left Eye Near:    ?Bilateral Near:    ? ?UC Couse / Diagnostics / Procedures:  ?  ?EKG ? ?Radiology ?No results found. ? ?Procedures ?Procedures (including critical care time) ? ?UC Diagnoses / Final Clinical Impressions(s)   ?I have  reviewed the triage vital signs and the nursing notes. ? ?Pertinent labs & imaging results that were available during my care of the patient were reviewed by me and considered in my medical decision making (see c

## 2021-09-21 NOTE — Discharge Instructions (Addendum)
To treat you for infection in your right ear, please begin cefdinir, 300 mg capsule twice daily for the next 10 days.  Please take all antibiotics as prescribed, do not skip doses and finish the entire 10-day course for best results.  Incomplete antibiotic treatment results and worsening infections that require stronger, more toxic antibiotics. ? ?Your rapid strep test today again was negative, we will also perform a throat culture again.  I think it is likely that the throat culture will also be negative but believe that you do have some form of bacterial throat infection going on at this time. ? ?The results of the cytology tests which will screen you for any oral STDs, should be available in the next 3 to 5 days.  If there are any positive results, you will be provided with treatment for these. ? ?The mono test that we performed today was negative. ? ?I have renewed your prescriptions for allergy medications including Zyrtec and Flonase.  I also provided a second nasal spray called Atrovent which helps the Flonase work a little bit better and a little bit faster.  These use the mall as prescribed.   ? ?Because you have persistent allergies and have had them for a while, I recommend that you continue the Flonase and Zyrtec through June.   ? ?You can stop using Atrovent nasal spray when your symptoms of congestion and nasal drainage improve, you can use Atrovent nasal spray again if they get worse while you are using Flonase and Zyrtec. ? ?Finally, I provided you with the Magic mouthwash prescription which contains nystatin, lidocaine, Benadryl and Maalox.  You can swish this mouthwash 4 times daily as needed, for 30 seconds each time, feel free to either gargle, spit it out or swallow it. ? ?Thank you for visiting urgent care today.  I sincerely hope that you feel better soon.  I appreciate the opportunity to participate in your care. ?

## 2021-09-21 NOTE — ED Triage Notes (Addendum)
Pt reports sore throat and bilateral ear pain. Pt was seen here 2 days ago for same and states the medicine prescribed has not been working.  ?

## 2021-09-22 LAB — CYTOLOGY, (ORAL, ANAL, URETHRAL) ANCILLARY ONLY
Chlamydia: NEGATIVE
Comment: NEGATIVE
Comment: NEGATIVE
Comment: NORMAL
Neisseria Gonorrhea: NEGATIVE
Trichomonas: NEGATIVE

## 2021-09-24 LAB — CULTURE, GROUP A STREP (THRC)

## 2022-05-04 ENCOUNTER — Other Ambulatory Visit: Payer: Self-pay

## 2022-05-04 ENCOUNTER — Encounter (HOSPITAL_COMMUNITY): Payer: Self-pay

## 2022-05-04 ENCOUNTER — Emergency Department (HOSPITAL_COMMUNITY)
Admission: EM | Admit: 2022-05-04 | Discharge: 2022-05-05 | Discharge status: Against Medical Advice | Payer: Medicaid Other | Attending: Emergency Medicine | Admitting: Emergency Medicine

## 2022-05-04 DIAGNOSIS — Z5321 Procedure and treatment not carried out due to patient leaving prior to being seen by health care provider: Secondary | ICD-10-CM | POA: Diagnosis not present

## 2022-05-04 DIAGNOSIS — R519 Headache, unspecified: Secondary | ICD-10-CM | POA: Insufficient documentation

## 2022-05-04 MED ORDER — IBUPROFEN 800 MG PO TABS
800.0000 mg | ORAL_TABLET | Freq: Once | ORAL | Status: AC
  Administered 2022-05-04: 800 mg via ORAL
  Filled 2022-05-04: qty 1

## 2022-05-04 NOTE — ED Provider Triage Note (Signed)
  Emergency Medicine Provider Triage Evaluation Note  MRN:  [id number]  Arrival date & time: 05/04/22    Medically screening exam initiated at 10:36 PM.   CC:   Headache  HPI:  Angie Mcdonald is a 21 y.o. year-old female presents to the ED with chief complaint of headache on the left side of head.  Pain is intermittent.  Lasts about 15-30s at a time.  It is sharp and stabbing in nature.  Has tried Tylenol without relief.  Denies fever.  Denies trouble speaking, seeing, or moving her extremities.  History provided by patient. ROS:  -As included in HPI PE:   Vitals:   05/04/22 2229  BP: 132/81  Pulse: 73  Resp: 16  Temp: 98.4 F (36.9 C)  SpO2: 100%    Non-toxic appearing No respiratory distress CN 3-12 intact MDM:  Based on signs and symptoms, headache is highest on my differential. I've ordered ibuprofen in triage to expedite lab/diagnostic workup.  Patient was informed that the remainder of the evaluation will be completed by another provider, this initial triage assessment does not replace that evaluation, and the importance of remaining in the ED until their evaluation is complete.    Montine Circle, PA-C 05/04/22 2238

## 2022-05-04 NOTE — ED Triage Notes (Signed)
Since Friday has had sharp shooting pains on left side of face/ head. Says it only lasts ~15 seconds then returns to normal.

## 2022-05-05 NOTE — ED Notes (Signed)
No response in waiting room.

## 2023-03-29 ENCOUNTER — Encounter: Payer: No Typology Code available for payment source | Admitting: Podiatry

## 2023-03-29 NOTE — Progress Notes (Signed)
Patient was a no-show for today's scheduled appointment

## 2024-04-11 ENCOUNTER — Encounter (HOSPITAL_COMMUNITY): Payer: Self-pay

## 2024-04-11 ENCOUNTER — Ambulatory Visit (INDEPENDENT_AMBULATORY_CARE_PROVIDER_SITE_OTHER): Payer: Self-pay

## 2024-04-11 ENCOUNTER — Ambulatory Visit (HOSPITAL_COMMUNITY)
Admission: EM | Admit: 2024-04-11 | Discharge: 2024-04-11 | Disposition: A | Discharge status: Home / Self Care | Payer: Self-pay | Attending: Family Medicine | Admitting: Family Medicine

## 2024-04-11 DIAGNOSIS — M79644 Pain in right finger(s): Secondary | ICD-10-CM

## 2024-04-11 DIAGNOSIS — S6991XA Unspecified injury of right wrist, hand and finger(s), initial encounter: Secondary | ICD-10-CM

## 2024-04-11 DIAGNOSIS — S62642D Nondisplaced fracture of proximal phalanx of right middle finger, subsequent encounter for fracture with routine healing: Secondary | ICD-10-CM

## 2024-04-11 MED ORDER — IBUPROFEN 800 MG PO TABS: 800.0000 mg | ORAL_TABLET | Freq: Three times a day (TID) | ORAL | 0 refills | Status: AC

## 2024-04-11 NOTE — ED Triage Notes (Signed)
 Pt states fell in a bush getting a ball 3wks ago. C/o pain and swelling to rt middle finger. Took otc meds with little relief.

## 2024-04-11 NOTE — ED Provider Notes (Signed)
 [facility]    CSN: [id number] Arrival date & time: 04/11/24  0854      History   Chief Complaint Chief Complaint  Patient presents with   Finger Injury    HPI Angie Mcdonald is a 23 y.o. female.   Patient presents with right middle finger pain and swelling after injury that occurred 3 weeks ago.  Patient states that she fell into a bush trying to get a ball out of the bush 3 weeks ago and injured her middle finger at that time.  Patient states that she has been able to move and use her middle finger but she has had pain and continued swelling to this finger.  Patient states that she has been taking over-the-counter pain medications with minimal relief.  Patient denies any other injuries from this fall.  The history is provided by the patient and medical records.    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 No pertinent surgical history.  OB History     Gravida  1   Para  1   Term  1   Preterm      AB      Living  1      SAB      IAB      Ectopic      Multiple  0   Live Births  1            Home Medications    Prior to Admission medications   Medication Sig Start Date End Date Taking? Authorizing Provider  ibuprofen  (ADVIL ) 800 MG tablet Take 1 tablet (800 mg total) by mouth 3 (three) times daily. 04/11/24  Yes Johnie, Hung Rhinesmith A, NP  cetirizine  (ZYRTEC ) 10 MG tablet Take 1 tablet (10 mg total) by mouth daily. 09/21/21 03/20/22  Joesph Shaver Scales, PA-C  fluticasone  (FLONASE ) 50 MCG/ACT nasal spray Place 2 sprays into both nostrils daily. 09/21/21   Joesph Shaver Scales, PA-C  ipratropium (ATROVENT ) 0.06 % nasal spray Place 2 sprays into both nostrils 3 (three) times daily. As needed for nasal congestion, runny  nose 09/21/21   Joesph Shaver Scales, PA-C  magic mouthwash (nystatin , lidocaine , diphenhydrAMINE , alum & mag hydroxide) suspension Swish and swallow 15 mLs 4 (four) times daily as needed for mouth pain. 09/21/21   Joesph Shaver Scales, PA-C  PROAIR  HFA 108 [phone] Base) MCG/ACT inhaler Inhale 2 puffs into the lungs every 4 (four) hours as needed for shortness of breath. 08/24/17   [provider]  Spacer/Aero-Holding Chambers (AEROCHAMBER PLUS) inhaler Use as instructed 05/25/16   Van Rosina, MD    Family History Family History  Problem Relation Age of Onset   Healthy Mother    Cancer Father    Diabetes Neg Hx    Heart failure Neg Hx     Social History Social History   Tobacco Use   Smoking status: Former    Types: Cigars   Smokeless tobacco: Never  Vaping Use   Vaping status: Never Used  Substance Use Topics   Alcohol use: Never   Drug use: Not Currently     Allergies   Patient has no known allergies.   Review of Systems Review of Systems  Per HPI  Physical Exam Triage Vital  Signs ED Triage Vitals  Encounter Vitals Group     BP 04/11/24 0942 (!) 130/93     Girls Systolic BP Percentile --      Girls Diastolic BP Percentile --      Boys Systolic BP Percentile --      Boys Diastolic BP Percentile --      Pulse Rate 04/11/24 0942 68     Resp 04/11/24 0942 18     Temp 04/11/24 0942 (!) 97.4 F (36.3 C)     Temp Source 04/11/24 0942 Oral     SpO2 04/11/24 0942 100 %     Weight --      Height --      Head Circumference --      Peak Flow --      Pain Score 04/11/24 0941 8     Pain Loc --      Pain Education --      Exclude from Growth Chart --    No data found.  Updated Vital Signs BP (!) 130/93 (BP Location: Right Arm)   Pulse 68   Temp (!) 97.4 F (36.3 C) (Oral)   Resp 18   LMP 04/03/2024 (Exact Date)   SpO2 100%   Visual Acuity Right Eye Distance:   Left Eye Distance:   Bilateral Distance:    Right Eye Near:   Left Eye Near:     Bilateral Near:     Physical Exam Vitals and nursing note reviewed.  Constitutional:      General: She is awake. She is not in acute distress.    Appearance: Normal appearance. She is well-developed and well-groomed. She is not ill-appearing.  Musculoskeletal:     Right hand: Swelling, tenderness and bony tenderness present. No deformity. Normal range of motion. Normal strength. Normal sensation. There is no disruption of two-point discrimination. Normal capillary refill. Normal pulse.     Comments: Tenderness noted over PIP joint of right middle finger.  Generalized swelling noted to right middle finger  Skin:    General: Skin is warm and dry.  Neurological:     Mental Status: She is alert.  Psychiatric:        Behavior: Behavior is cooperative.      UC Treatments / Results  Labs (all labs ordered are listed, but only abnormal results are displayed) Labs Reviewed - No data to display  EKG   Radiology DG Finger Middle Right Result Date: 04/11/2024 CLINICAL DATA:  Pain and swelling of the right middle finger. History of fall into a bush approximately 3 weeks ago. EXAM: RIGHT MIDDLE FINGER 2+V COMPARISON:  None Available. FINDINGS: There is no evidence of fracture or dislocation. There is no evidence of arthropathy or other focal bone abnormality. Soft tissue swelling of the third digit. No soft tissue gas or radiopaque foreign body. IMPRESSION: 1. No acute osseous abnormality. 2. Soft tissue swelling of the third digit. Electronically Signed   By: Harrietta Sherry M.D.   On: 04/11/2024 10:33    Procedures Procedures (including critical care time)  Medications Ordered in UC Medications - No data to display  Initial Impression / Assessment and Plan / UC Course  I have reviewed the triage vital signs and the nursing notes.  Pertinent labs & imaging results that were available during my care of the patient were reviewed by me and considered in my medical decision making (see  chart for details).     Patient is overall well-appearing.  Vitals are stable.  X-ray ordered.  Based on my interpretation there appears to be some evidence of a healed fracture to the middle distal aspect of the proximal phalanx of the right finger.  Radiology report does not suggest this but does reveal soft tissue swelling of the third digit.  Provided patient with finger splint to help promote healing.  Prescribed ibuprofen  as needed for pain and recommended alternating this with Tylenol  as needed.  Given orthopedic follow-up if needed.  Discussed follow-up and return precautions. Final Clinical Impressions(s) / UC Diagnoses   Final diagnoses:  Pain of right middle finger  Injury of finger of right hand, initial encounter     Discharge Instructions      Your x-ray does reveal some evidence of a healing fracture to your middle finger.  This is likely contributing to your pain and swelling. Wear the finger splint to help stabilize the joint to promote continued healing. Alternate between 650 mg of Tylenol  and 800 mg ibuprofen  as needed for pain. You can also apply ice to help with swelling. Follow-up with [place] [facility] if your pain and swelling continue for further evaluation.    ED Prescriptions     Medication Sig Dispense Auth. Provider   ibuprofen  (ADVIL ) 800 MG tablet Take 1 tablet (800 mg total) by mouth 3 (three) times daily. 21 tablet Johnie Flaming A, NP      PDMP not reviewed this encounter.   Johnie Flaming A, NP 04/11/24 1043

## 2024-04-11 NOTE — Discharge Instructions (Addendum)
 Your x-ray does reveal some evidence of a healing fracture to your middle finger.  This is likely contributing to your pain and swelling. Wear the finger splint to help stabilize the joint to promote continued healing. Alternate between 650 mg of Tylenol  and 800 mg ibuprofen  as needed for pain. You can also apply ice to help with swelling. Follow-up with [place] [facility] if your pain and swelling continue for further evaluation.

## 2024-04-12 ENCOUNTER — Other Ambulatory Visit: Payer: Self-pay

## 2024-04-12 ENCOUNTER — Ambulatory Visit: Payer: Self-pay | Admitting: Internal Medicine

## 2024-04-12 ENCOUNTER — Encounter: Payer: Self-pay | Admitting: Internal Medicine

## 2024-04-12 VITALS — BP 132/88 | Ht <= 58 in | Wt 225.0 lb

## 2024-04-12 DIAGNOSIS — M79644 Pain in right finger(s): Secondary | ICD-10-CM

## 2024-04-12 NOTE — Progress Notes (Unsigned)
 PCP: Patient, No Pcp Per  Subjective:   HPI: Patient is a 23 y.o. female here for evaluation of right middle finger injury.  She states that she fell into a thorn bush approximately 3 weeks ago while attempting to get a ball out of the bush.  She has had pain and swelling over the PIP joint of the right middle finger since that time.  Pain is worse with flexion.  Minimal pain with extension.  Denies pain at the DIP or MCP joints.  She thought that the pain and swelling were improving until recently.  She went to urgent care yesterday (10/7).  X-rays showed soft tissue swelling but otherwise no acute osseous abnormalities.  She was given a finger splint and referred to sports medicine for further evaluation..  Past Medical History:  Diagnosis Date   Asthma    Environmental allergies    Vitamin D deficiency 11/26/2017    Current Outpatient Medications on File Prior to Visit  Medication Sig Dispense Refill   cetirizine  (ZYRTEC ) 10 MG tablet Take 1 tablet (10 mg total) by mouth daily. 90 tablet 1   fluticasone  (FLONASE ) 50 MCG/ACT nasal spray Place 2 sprays into both nostrils daily. 48 mL 1   ibuprofen  (ADVIL ) 800 MG tablet Take 1 tablet (800 mg total) by mouth 3 (three) times daily. 21 tablet 0   ipratropium (ATROVENT ) 0.06 % nasal spray Place 2 sprays into both nostrils 3 (three) times daily. As needed for nasal congestion, runny nose 15 mL 2   magic mouthwash (nystatin , lidocaine , diphenhydrAMINE , alum & mag hydroxide) suspension Swish and swallow 15 mLs 4 (four) times daily as needed for mouth pain. 360 mL 0   PROAIR  HFA 108 (90 Base) MCG/ACT inhaler Inhale 2 puffs into the lungs every 4 (four) hours as needed for shortness of breath.  0   Spacer/Aero-Holding Chambers (AEROCHAMBER PLUS) inhaler Use as instructed 1 each 2   No current facility-administered medications on file prior to visit.    No past surgical history on file.  No Known Allergies  BP 132/88   Ht 4' 9.5 (1.461 m)    Wt 225 lb (102.1 kg)   LMP 04/03/2024 (Exact Date)   BMI 47.85 kg/m       No data to display              No data to display              Objective:  Physical Exam:  Gen: NAD, comfortable in exam room  Right Middle Finger Mild swelling and erythema are located over the flexor and extensor aspects of the PIP joint TTP along the flexor surface of the PIP joint ROM from 0-85 degrees of flexion at the PIP joint, further limited due to pain FROM at the DIP and MCP joints NV intact  Limited MSK US  Right Middle Finger -Normal appearing PIP joint without acute findings. -No increased hypoechoic fluid within the flexor tendon sheath, not consistent with tenosynovitis   Assessment & Plan:  1. Right Middle Finger Injury Right middle finger injury approximately 3 weeks ago.  Pain is located along the flexor aspect of the PIP joint.  Limited ultrasound of the digit is reassuring.  There is no evidence of flexor tenosynovitis.  Treatment options reviewed.  Recommend buddy taping for support.  NSAIDs and icing for pain relief.  She will return to care if pain worsens or fails to improve after an additional 3 weeks, at which time she would be  6 weeks out from the initial injury.
